# Patient Record
Sex: Male | Born: 1965
Health system: Southern US, Community
[De-identification: ages and names within clinical notes are randomized; demographics above are authoritative.]

## PROBLEM LIST (undated history)

## (undated) DIAGNOSIS — G629 Polyneuropathy, unspecified: Secondary | ICD-10-CM

## (undated) DIAGNOSIS — E119 Type 2 diabetes mellitus without complications: Secondary | ICD-10-CM

## (undated) DIAGNOSIS — F32A Depression, unspecified: Secondary | ICD-10-CM

## (undated) DIAGNOSIS — F341 Dysthymic disorder: Secondary | ICD-10-CM

## (undated) DIAGNOSIS — E785 Hyperlipidemia, unspecified: Secondary | ICD-10-CM

## (undated) DIAGNOSIS — F329 Major depressive disorder, single episode, unspecified: Secondary | ICD-10-CM

## (undated) HISTORY — PX: TOE AMPUTATION: SHX809

## (undated) HISTORY — PX: APPENDECTOMY: SHX54

---

## 2004-12-08 ENCOUNTER — Inpatient Hospital Stay (HOSPITAL_COMMUNITY): Admission: EM | Admit: 2004-12-08 | Discharge: 2004-12-11 | Payer: Self-pay | Admitting: Emergency Medicine

## 2005-03-16 ENCOUNTER — Inpatient Hospital Stay (HOSPITAL_COMMUNITY): Admission: AD | Admit: 2005-03-16 | Discharge: 2005-03-17 | Payer: Self-pay | Admitting: Orthopedic Surgery

## 2005-03-16 ENCOUNTER — Encounter (INDEPENDENT_AMBULATORY_CARE_PROVIDER_SITE_OTHER): Payer: Self-pay | Admitting: Specialist

## 2006-07-25 ENCOUNTER — Encounter (HOSPITAL_BASED_OUTPATIENT_CLINIC_OR_DEPARTMENT_OTHER): Admission: RE | Admit: 2006-07-25 | Discharge: 2006-09-09 | Payer: Self-pay | Admitting: Surgery

## 2006-07-27 ENCOUNTER — Ambulatory Visit (HOSPITAL_COMMUNITY): Admission: RE | Admit: 2006-07-27 | Discharge: 2006-07-27 | Payer: Self-pay | Admitting: Surgery

## 2006-09-09 ENCOUNTER — Encounter (HOSPITAL_BASED_OUTPATIENT_CLINIC_OR_DEPARTMENT_OTHER): Admission: RE | Admit: 2006-09-09 | Discharge: 2006-10-07 | Payer: Self-pay | Admitting: Surgery

## 2006-10-13 ENCOUNTER — Encounter (HOSPITAL_BASED_OUTPATIENT_CLINIC_OR_DEPARTMENT_OTHER): Admission: RE | Admit: 2006-10-13 | Discharge: 2006-11-09 | Payer: Self-pay | Admitting: Internal Medicine

## 2008-04-08 ENCOUNTER — Ambulatory Visit (HOSPITAL_COMMUNITY): Admission: RE | Admit: 2008-04-08 | Discharge: 2008-04-08 | Payer: Self-pay | Admitting: Orthopedic Surgery

## 2008-04-08 ENCOUNTER — Encounter: Admission: RE | Admit: 2008-04-08 | Discharge: 2008-04-08 | Payer: Self-pay | Admitting: Internal Medicine

## 2008-04-16 ENCOUNTER — Encounter (HOSPITAL_BASED_OUTPATIENT_CLINIC_OR_DEPARTMENT_OTHER): Admission: RE | Admit: 2008-04-16 | Discharge: 2008-06-05 | Payer: Self-pay | Admitting: Internal Medicine

## 2008-04-18 ENCOUNTER — Ambulatory Visit (HOSPITAL_COMMUNITY): Admission: RE | Admit: 2008-04-18 | Discharge: 2008-04-18 | Payer: Self-pay | Admitting: Internal Medicine

## 2008-11-16 ENCOUNTER — Emergency Department (HOSPITAL_COMMUNITY): Admission: EM | Admit: 2008-11-16 | Discharge: 2008-11-16 | Payer: Self-pay | Admitting: Emergency Medicine

## 2008-12-30 ENCOUNTER — Ambulatory Visit (HOSPITAL_COMMUNITY): Admission: RE | Admit: 2008-12-30 | Discharge: 2008-12-30 | Payer: Self-pay | Admitting: Internal Medicine

## 2009-09-01 ENCOUNTER — Ambulatory Visit (HOSPITAL_COMMUNITY): Admission: RE | Admit: 2009-09-01 | Discharge: 2009-09-01 | Payer: Self-pay | Admitting: General Surgery

## 2009-09-01 ENCOUNTER — Encounter (HOSPITAL_BASED_OUTPATIENT_CLINIC_OR_DEPARTMENT_OTHER): Admission: RE | Admit: 2009-09-01 | Discharge: 2009-11-04 | Payer: Self-pay | Admitting: General Surgery

## 2009-09-10 ENCOUNTER — Ambulatory Visit (HOSPITAL_COMMUNITY): Admission: RE | Admit: 2009-09-10 | Discharge: 2009-09-10 | Payer: Self-pay | Admitting: General Surgery

## 2009-09-10 ENCOUNTER — Ambulatory Visit: Payer: Self-pay | Admitting: Vascular Surgery

## 2009-09-19 ENCOUNTER — Encounter: Admission: RE | Admit: 2009-09-19 | Discharge: 2009-09-19 | Payer: Self-pay | Admitting: General Surgery

## 2009-09-24 ENCOUNTER — Ambulatory Visit (HOSPITAL_COMMUNITY): Admission: RE | Admit: 2009-09-24 | Discharge: 2009-09-24 | Payer: Self-pay | Admitting: General Surgery

## 2010-10-04 LAB — GLUCOSE, CAPILLARY: Glucose-Capillary: 186 mg/dL — ABNORMAL HIGH (ref 70–99)

## 2010-11-24 NOTE — Assessment & Plan Note (Signed)
[facility]   NAME:  Kevin Monroe, Kevin Monroe              ACCOUNT NO.:  [id number]   MEDICAL RECORD NO.:  [id number]      DATE OF BIRTH:  1966-01-29   PHYSICIAN:  Barry Dienes. Eloise Harman, M.D.     VISIT DATE:                                   OFFICE VISIT   SUBJECTIVE:  The patient is a 45 year old Caucasian man who was seen for  reevaluation of a neuropathic ulcer overlying the left first metatarsal  head.  He has diabetes mellitus, type 1, which has been under good  control with use of an insulin pump.  He also has a history of partial  amputation of the left great toe.  He was recently started on  doxycycline 100 mg twice daily and ciprofloxacin 500 mg twice daily.  He  has been treated with total contact casting while awaiting approval for  hyperbaric oxygen treatment.  His cast cracked recently, so he presented  today earlier for evaluation.  At last visit, he had ear wax removed  from the right ear.  He has mild discomfort in the right year with  minimal amount of blood coming from the ear since that time.  His  hearing is somewhat dull in the right ear.  He has not had fever or  chills.   OBJECTIVE:  VITAL SIGNS:  Blood pressure 118/79, pulse 70, respirations  18, temperature 98.2, and most recent capillary blood glucose level 110.  HEENT:  His right external auditory canal had a moderate amount of wax  within it and a small area of blood.  He has partially visualized  tympanic membrane which was normal.  He showed no signs of external  auditory canal infection.   Condition of his wound is as follows:  Wound #2:  Located on the left  foot overlying the first metatarsal head and measures 1.4 cm x 1.4 cm x  0.5 cm.  This wound does not have significant covering eschar and has a  pale pink wound base with no significant granulation tissue and no  exposed bone, tendon, or muscle.  There is no foul odor.  There is a  moderate amount of serosanguineous discharge and some  callus and  macerated tissue surrounding the ulcer.  There is a small amount of  necrotic tissue on the medial aspect of the ulcer.   ASSESSMENT:  No significant improvement in left foot neuropathic ulcer,  likely due to underlying myofasciitis seen on recent MRI scan.   PLAN:  The necrotic material was debrided from the ulcer with a #15  scalpel without significant pain or bleeding.  In addition, the necrotic  skin surrounding the ulcer was debrided with a #15 scalpel without  significant bleeding or discomfort.  There was some callus buildup on  the remnant of the left great toe that was debrided without difficulty.  The wound ulcer was then treated with Iodosorb covered by bulky dressing  followed by a total contact cast which should be reassessed in  approximately 1 week.  Plans are underway for him to have hyperbaric  oxygen treatment pending insurance company approval.  He also has a  scheduled appointment to see Dr. Dillard Cannon on April 26, 2008,  to evaluate the ear wax impaction and blood  within the right external  auditory canal.           ______________________________  Barry Dienes. Eloise Harman, M.D.     DGP/MEDQ  D:  04/23/2008  T:  04/24/2008  Job:  [id number]

## 2010-11-24 NOTE — Consult Note (Signed)
Kevin Monroe, Kevin Monroe              ACCOUNT NO.:  [id number]   MEDICAL RECORD NO.:  [id number]          PATIENT TYPE:  REC   LOCATION:  FOOT                         FACILITY:  [facility]   PHYSICIAN:  Jonelle Sports. Sevier, M.D. DATE OF BIRTH:  [birth year]   DATE OF CONSULTATION:  04/17/2008  DATE OF DISCHARGE:                                 CONSULTATION   HISTORY:  This is a 45 year old white male with long-standing type 1  diabetes had previously difficulty with a toe wound with osteomyelitis  in 2006.  At which time, he underwent amputation of his distal phalanx  of the left hallux.  Subsequent to that he never completely healed and  became infected again and was seen in this center and given a course of  hyperbaric oxygen therapy together with antibiotics and total contact  casting and the wound had completely healed by the time he was last seen  here by Dr. Tanda Rockers back in 2008.   He did well for awhile and then subsequently developed an ulcer on the  plantar aspect of the remaining proximal phalanx of that toe.  This  again stood over time, became somewhat infected, and he was seen and  placed on a course of antibiotics, namely doxycycline and Cipro twice  daily and sent for MRI, which showed evidence of myofasciitis and  tendinitis but no definite osteomyelitis.  There was no deep abscess.   Accordingly, he was referred here for further evaluation of the advice.  The initial orthopedist thinking that in all likelihood, he will need  open surgery again but now hoping that it can be avoided.   Past medical history is really essentially unchanged from his previous  visit and contains nothing great to concern.   He does have medication list at the moment, which includes:  1. NovoLog insulin on continuous infusion by pump.  2. Lisinopril 5 mg daily.  3. Celexa 40 mg daily.   He has known medicinal allergies.   In terms of review of systems, he is otherwise as far as he knows  completely well now.   PHYSICAL EXAMINATION:  Blood pressure  130/75, pulse 71, respirations  18, and temperature 97.9.  He is in no immediate distress.  Head, eyes,  ears, nose and throat show normal pupils.  Fundi are not visualized.  The left tympanic membrane is normal.  The right auditory canal is  impacted with dark wax, which is curetted out with some minor trauma to  the canal but then without tympanic membrane being benign as well.  His  chest is clear.  His heart is regular without murmur or gallop.  His  extremities are free of edema.  Distal pulses are everywhere palpable  and quite adequate.  Underlying  the plantar aspect of the remaining  stump of the left hallux is an ulcer measuring 1.4 x 1.4 x 0.6 cm in  depth.  It probes only to a fascial level and not definitely to bone.  There is some superficial redness of the entirety of the toe indicating  the underlying infection, which hopefully at this point  is not yet into  the bone.   DIAGNOSTIC IMPRESSION:  1. Diabetic foot ulcer.  2. [address] stage III left hallux stump, plantar aspect.   DISPOSITION:  The patient has been on doxycycline 100 mg b.i.d. and  Cipro 500 mg b.i.d. for approximately this 9 days.  He is to complete a  10-day course of that and is given another 10 days to add immediately to  follow up.   He will today be dressed with an application of Iodosorb underlying a  Allevyn pad and that extremity will be placed with a total contact cast.   Following obtaining of appropriate lab data etc., he will be started on  hyperbaric oxygen therapy to begin the day following this visit to  receive initially prescribed 20 dives at 2.4 atmospheres for 90 minutes  with two 5-minute air breaks each time.   Followup visit for review of the wound itself will be in 8 days.           ______________________________  Jonelle Sports Cheryll Cockayne, M.D.     RES/MEDQ  D:  04/17/2008  T:  04/18/2008  Job:  [id number]   cc:   Georgann Housekeeper, MD  Dr. August Saucer

## 2010-11-24 NOTE — Assessment & Plan Note (Signed)
[facility]   NAME:  Kevin Monroe, Kevin Monroe              ACCOUNT NO.:  [id number]   MEDICAL RECORD NO.:  [id number]      DATE OF BIRTH:  1965/09/15   PHYSICIAN:  Lenon Curt. Chilton Si, M.D.   VISIT DATE:  05/10/2008                                   OFFICE VISIT   HISTORY:  A 45 year old male with ulceration of the left great toe.  He  is undergoing hyperbaric oxygen therapy for this and is doing well.  There is some maceration in between the toes of the left foot.  The  ulceration of the toes.  He is making reasonably good progress with pink  granulation tissue at this time and minimal surrounding callus.  The  patient is satisfied with current treatment and response to therapy.   EXAM:  The left first metatarsal head and toes, wound measures 1.7 x 1.7  x 0.1.  It is clean and there is good granulation tissue.  There is some  maceration between the toes of the left foot.   TREATMENT:  Total contact casting was completed after HBO therapy.  Recommended application of Lamisil between the toes.  The patient will  return in a week for followup evaluation for repeat casting.      Lenon Curt Chilton Si, M.D.  Electronically Signed     AGG/MEDQ  D:  05/10/2008  T:  05/11/2008  Job:  [id number]

## 2010-11-24 NOTE — Assessment & Plan Note (Signed)
[facility]   NAME:  Kevin Monroe, Kevin Monroe              ACCOUNT NO.:  [id number]   MEDICAL RECORD NO.:  [id number]      DATE OF BIRTH:  1966-04-20   PHYSICIAN:  Jonelle Sports. Sevier, M.D.  VISIT DATE:  05/01/2008                                   OFFICE VISIT   HISTORY:  This 45 year old white male diabetic with left hallucal stump  ulcer is seen for weekly evaluation in association with total contact  casting and hyperbaric oxygen treatments.  He has completed today his  second HBO treatment.  He has been in a cast now for a total of 2 weeks.   The patient's original MRI showed a lot of deep space inflammation  without any definite loculation of pus and he has received appropriate  courses of antibiotic therapy.  Because of these ligamentous and other  deep structures being involved with an inflammatory process, it was felt  this really constituted a Wagner grade 3 ulcer.  Accordingly, the basis  for aggressive treatment as we are undertaking.   The patient reports that he has had no problems with a cast itself, nor  he has had any difficulty with HBO treatments (had been treated once  before).   PHYSICAL EXAMINATION:  VITAL SIGNS:  Blood pressure 148/82, pulse 70,  respirations 20, and temperature 98.1.  EXTREMITIES:  The wound on the plantar aspect of the hallucal stump on  the left now the plantar aspect measures 1.9 x 1.9 x 0.2 cm.  The base  is fairly clean.  There is no spreading inflammation.  No tenderness.  No drainage.  No odor.  The degree of swelling and erythema in the  remainder of the hallux in that first MP joint area is striking less  than when his treatment began 2 weeks ago.   IMPRESSION:  Satisfactory course early on and Wagner stage 3 diabetic  foot ulcer.   DISPOSITION:  The patient will be treated with a repeat application of  Iodosorb covered by dry dressing and the left lower extremity returned  to total contact cast.   He will continue  hyperbaric oxygen therapy treatments on a 5-day per  week basis.   The next wound evaluation will be in 1 week unless indicated sooner.            ______________________________  Jonelle Sports Cheryll Cockayne, M.D.     RES/MEDQ  D:  05/01/2008  T:  05/02/2008  Job:  [id number]

## 2010-11-24 NOTE — Assessment & Plan Note (Signed)
[facility]   NAME:  Kevin, Monroe              ACCOUNT NO.:  [id number]   MEDICAL RECORD NO.:  [id number]      DATE OF BIRTH:  12-Oct-1965   PHYSICIAN:  Maxwell Caul, M.D. VISIT DATE:  06/04/2008                                   OFFICE VISIT   [name] is a 45 year old type 1 diabetic who is status post partial  amputation of his left first toe.  He has had an ulcer on the first  metatarsal head associated with a deep infection.  He completed  hyperbaric oxygen therapy, and the wound has been closing down nicely.   On examination, his temperature is 98.5, pulse 75, respirations 20,  blood pressure 125/72.  The area of the previous Wagner grade 3 wound is  resolved.  There was no need of debridement.  There was nothing that  needed culturing here.  The wound is totally epithelialized.  I have  advised him to continue to keep this area covered.  He has custom-made  inserts for his shoes, and he is discharged at this point.  We will see  him again on p.r.n. as necessary.           ______________________________  Maxwell Caul, M.D.     MGR/MEDQ  D:  06/04/2008  T:  06/04/2008  Job:  [id number]   cc:   G. Dorene Grebe, M.D.  Georgann Housekeeper, MD

## 2010-11-24 NOTE — Assessment & Plan Note (Signed)
[facility]   NAME:  Kevin Monroe, Kevin Monroe NO.:  [id number]   MEDICAL RECORD NO.:  [id number]      DATE OF BIRTH:  09-09-1965   PHYSICIAN:  Maxwell Caul, M.D. VISIT DATE:  05/16/2008                                   OFFICE VISIT   [name] is a gentleman who we have been treating here for what was a  Wagner grade 3 wound over his left foot overlying the first metatarsal  head.  This has been receiving HBO, as well as a total contact casting.  He has really done quite nicely.  Today, the wound is really minuscule.  There was a slight amount of hyper granulation protruding from some  really quite healthy looking epithelialization.  We knocked down the  hypergranulation with a silver nitrate stick.  At this point, I really  do not think that HBO will need to be continued __________  tomorrow.  Furthermore, I do not think the total contact casting is necessary.  We  simply put him in a Darco Wedge.   IMPRESSION:  Wagner grade 3 diabetic foot wound.  This almost has  completely resolved.  We have put him in a Darco Wedge with a dry  dressing.  We will see him back in a week.  He will do his last  __________ tomorrow.  The TCC-EZ was also discontinued today.           ______________________________  Maxwell Caul, M.D.     MGR/MEDQ  D:  05/16/2008  T:  05/17/2008  Job:  [id number]

## 2010-11-24 NOTE — Assessment & Plan Note (Signed)
[facility]   NAME:  Kevin Monroe, Kevin Monroe              ACCOUNT NO.:  [id number]   MEDICAL RECORD NO.:  [id number]      DATE OF BIRTH:  01/17/1966   PHYSICIAN:  Jonelle Sports. Sevier, M.D.  VISIT DATE:  05/22/2008                                   OFFICE VISIT   HISTORY:  This 45 year old white male with type 1 diabetes, status post  partial amputation of the left hallux, has been managed for an ulcer of  the first metatarsal head area with deep infection.  He has completed a  course of hyperbaric oxygen therapy, and the wound now is completely  scabbed over.  He is here today for what was hoped to be his last  clinical visit.   He has been in a Darco walker and continues on doxycycline and Cipro,  which he had been given for the deep infection, which we will continue  for approximately an additional week.  He has had no fever, chills, or  systemic symptoms.  The swelling in the area has progressively  decreased, and there have been no other signs of difficulty.   PHYSICAL EXAMINATION:  Blood pressure 123/79, pulse 78, respirations 16,  and temperature 98.4.  Random postprandial glucose 263 mg/dL.   The wound on the plantar aspect of the left foot at the first metatarsal  head area is totally calloused over at this point.  There is no evidence  of surrounding erythema, no pain or tenderness.   IMPRESSION:  Resolution of diabetic foot ulcer, originally Wagner 3,  plantar aspect of the left foot.   DISPOSITION:  The wound is selectively debrided of the callus and with  this, shows an epithelialized underlying wound.   The wound will be treated with an application of Neosporin covered with  a Band-Aid, and the patient will be instructed to do this on a daily  basis.   He will continue in his Darco wedge healing sandal, and is due to report  tomorrow to the orthotic shot for fashioning of the proper custom-molded  insert with a mark to prevent the pistoning of that  first toe and  metatarsal head complex.   Followup visit is set here for 2 weeks.           ______________________________  Jonelle Sports Cheryll Cockayne, M.D.     RES/MEDQ  D:  05/22/2008  T:  05/22/2008  Job:  [id number]

## 2010-11-27 NOTE — Consult Note (Signed)
NAMEAUTUMN, [name]              ACCOUNT NO.:  [id number]   MEDICAL RECORD NO.:  [id number]          PATIENT TYPE:  INP   LOCATION:  3020                         FACILITY:  [facility]   PHYSICIAN:  Burnard Bunting, M.D.    DATE OF BIRTH:  April 07, 1966   DATE OF CONSULTATION:  12/08/2004  DATE OF DISCHARGE:                                   CONSULTATION   CHIEF COMPLAINT:  Left great toe pain.   HISTORY OF PRESENT ILLNESS:  Kevin Monroe is a 45 year old patient with  type 1 diabetes who describes about a three-year history of left great toe  ulceration, he also describes a two-day history of fever, chills and pain  and left great toe pain.  He states about six months ago he ripped the skin  off his great toe and he has had a bleeding ulcer on and off since, he has  [facility].  He states  that the ulcer has never quite fully healed.  He denies any weight loss or  any other chest pain or shortness of breath.   PAST MEDICAL HISTORY:  1. Diabetes.  2. Dysthymia.  3. Allergic rhinitis.     FAMILY HISTORY:  Notable for mother with diabetes and thyroid disease, she  is 89 years old.  Father comitted suicide at age 5 with diabetes and  depression.  He has a sister with diabetes.  The patient does not smoke.  He  drinks occasionally.  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   MEDICATIONS ON ADMISSION:  Lantus 35 units q.h.s. as well as sliding-scale  NovoLog.   HE HAS NO KNOWN DRUG ALLERGIES.   REVIEW OF SYSTEMS:  There is no chest pain, shortness of breath.  Does  report bilateral foot neuropathy.   PHYSICAL EXAMINATION:  On admission temperature 102.7, blood pressure  102/47, pulse 88, respirations 18, 96% sat on room air.  He has good  cervical spine range of motion, full range of motion of the upper  extremities, wrists, elbows and shoulders.  Palpable radial pulses.  No  motor sensory  function bilateral upper extremities is intact, bilateral  lower extremities demonstrates the pedal pulses palpable in both feet.  He  has palpable intact nontender anterior tib, posterior __________and Achilles  tendon bilaterally with symmetric range of motion tibia talar transverse  tarsal and subtalar region motion.  He does have swelling of his great toe.  He has diabetic nephropathy with decreased sensation to light touch on the  dorsal plantar aspect of both feet.  He has an ulceration with purulent  discharge from the left great toe.  The great toe has some swelling and  erythema.  There is no dorsal or plantar swelling or erythema on the  forefoot.  He has good range of motion of the great toe.   LABORATORY VALUES:  Include white count 13,000, hemoglobin 15.8, sodium 134,  potassium 3.8, BUN 16, creatinine 1.2, blood glucose 280, he has no edema in  the  lower extremities.   IMPRESSION:  Foot ulceration and infection.   PLAN:  Radiographs, which are pending.  He will need incision and drainage  today, and if he does have osteomyelitis he may require an  metatarsophalangeal amputation.  We will make him n.p.o., hold his Lovenox  and plan for surgery this afternoon.  Risks and benefits were discussed with  the patient, they include, but are not limited to possible further  amputation on the foot as well as nonhealing of the wound.  The patient  understands the risks and wishes to proceed.  All questions were answered.      GSD/MEDQ  D:  12/08/2004  T:  12/08/2004  Job:  [id number]

## 2010-11-27 NOTE — Assessment & Plan Note (Signed)
[facility]   NAME:  Kevin Monroe, Kevin Monroe              ACCOUNT NO.:  [id number]   MEDICAL RECORD NO.:  [id number]           DATE OF BIRTH:   PHYSICIAN:  Maxwell Caul, M.D. VISIT DATE:  09/02/2006                                   OFFICE VISIT   PURPOSE OF TODAY'S VISIT:  Followup of a Wagner's grade III wound on his  foot, involving the base of his left hallux.  He has been treated with  continued HBO, full contact casting.   WOUND EXAM:  This wound is now 100% epithelized.  I had considered not  replacing the cast; however, in looking at his history and the fact that  the overlying skin is not fully healed, I elected to put him back in the  cast.  I think that by next week, at this time, he should be ready for  discharge.  I wrote him for a pair of diabetic foot wear, which  surprisingly he has never owned.   MANAGEMENT PLAN & GOAL:  We have placed him back in the contact cast.  I  think after next week's dives he should be fully ready for discharge at  that point.  I am forecasting full healing of this in the next week.           ______________________________  Maxwell Caul, M.D.     MGR/MEDQ  D:  09/02/2006  T:  09/03/2006  Job:  [id number]

## 2010-11-27 NOTE — Op Note (Signed)
NAMELUISDAVID, Kevin Monroe              ACCOUNT NO.:  [id number]   MEDICAL RECORD NO.:  [id number]          PATIENT TYPE:  INP   LOCATION:  5012                         FACILITY:  [facility]   PHYSICIAN:  Burnard Bunting, M.D.    DATE OF BIRTH:  1965-11-02   DATE OF PROCEDURE:  03/16/2005  DATE OF DISCHARGE:  03/17/2005                                 OPERATIVE REPORT   PREOPERATIVE DIAGNOSIS:  Left great toe infection.   POSTOPERATIVE DIAGNOSIS:  Left great toe infection.   OPERATION PERFORMED:  Left great toe amputation.   SURGEON:  Burnard Bunting, M.D.   ANESTHESIA:  General.   ESTIMATED BLOOD LOSS:  Minimal.   SPECIMENS:  Toe to pathology.   DESCRIPTION OF PROCEDURE:  The patient was brought to the operating room  where general endotracheal anesthesia was induced.  Preoperative antibiotics  were administered.  The left foot was prepped with Hibiclens and saline and  draped in sterile manner.  [place] incision was made in the midportion of  the proximal phalanx.  Skin and subcutaneous tissue were sharply divided.  Periosteal elevation was used to develop full thickness skin and periosteal  flaps off of the proximal phalanx.  The oscillating saw was then used to  amputate the proximal phalanx at the junction of its proximal and distal two  thirds.  Healthy skin flaps were maintained.  Necrotic and infected  appearing tissue was distal to the incision.  Ankle Esmarch was utilized for  this portion of the case.  It was released.  Good bleeding tissue and bone  was noted.  Skin flaps were then closed using 3-0 nylon suture with  excellent closure achieved.  The patient was placed in a bulky dressing,  tolerated procedure well without immediate complications.           ______________________________  G. Dorene Grebe, M.D.     GSD/MEDQ  D:  05/13/2005  T:  05/13/2005  Job:  [id number]

## 2010-11-27 NOTE — Assessment & Plan Note (Signed)
[facility]   NAME:  Kevin Monroe, Kevin Monroe              ACCOUNT NO.:  [id number]   MEDICAL RECORD NO.:  [id number]      DATE OF BIRTH:  1966-06-23   PHYSICIAN:  Maxwell Caul, M.D.      VISIT DATE:                                   OFFICE VISIT   PURPOSE OF TODAY'S VISIT:  Continued followup of his ulcer on the  plantar aspect of his left great toe, he is being seen in conjunction  with continued HBO and total-contact casting.   WOUND EXAM:  This wound is now resolved.  I did debride a large amount  of callous from the surrounding area of this wound, however there are no  open areas.  The wound itself is covered by healthy epithelization.   MANAGEMENT PLAN & GOAL:  He can discontinue from HBO now.  I have  advised him to clean the area with an anti-bacterial soap and protect it  with something such as Allevyn adhesive.  He is to continue in his  healing sandal.  He has an appointment next week for diabetic footwear.  We will see him back in followup in two weeks.  The wound as of this  point is declared resolved.           ______________________________  Maxwell Caul, M.D.     MGR/MEDQ  D:  09/16/2006  T:  09/16/2006  Job:  [id number]

## 2010-11-27 NOTE — Assessment & Plan Note (Signed)
[facility]   NAME:  Kevin Monroe, Kevin Monroe              ACCOUNT NO.:  [id number]   MEDICAL RECORD NO.:  [id number]      DATE OF BIRTH:  [birth year]   PHYSICIAN:  Maxwell Caul, M.D.      VISIT DATE:                                   OFFICE VISIT   PURPOSE OF TODAY'S VISIT:  Patient is seen in follow up from his  hyperbaric treatment.  He is being treated for an ulcer on the plantar  aspect of his left great toe.  He has been treated with hyperbaric,  oxygen and total contact casting.  There has been continuous improvement  in this area.   WOUND EXAM:  The wound is now close to 100% reepithialized.  I did a  debridement of a large amount of callous from this wound and surrounding  areas.  There is only a tiny bit of open area in the middle of the  wound.  The rest of this looks like healthy epithelization.   WOUND CARE PLAN AND FOLLOWUP:  I am going to continue him with  hyperbaric oxygen for one more week and total contact casting for one  more week.  Although this is almost 100% healed I just do not think the  area is vibrant enough to survive without pressure relief.  I did give  him a prescription for diabetic foot wear last wear.  I do not think he  has filled it as of yet.           ______________________________  Maxwell Caul, M.D.     MGR/MEDQ  D:  09/09/2006  T:  09/10/2006  Job:  [id number]

## 2010-11-27 NOTE — Assessment & Plan Note (Signed)
[facility]   NAME:  Kevin Monroe, Kevin Monroe              ACCOUNT NO.:  [id number]   MEDICAL RECORD NO.:  [id number]      DATE OF BIRTH:  04-21-1966   PHYSICIAN:  Maxwell Caul, M.D. VISIT DATE:  10/13/2006                                   OFFICE VISIT   IMPRESSION:  Purpose of today's visit, recurrent blister on left great  toe.   HISTORY:  The patient is a patient who was recently completed hyperbaric  oxygen therapy for Wegner's grade 3 diabetic foot wound.  He had been  discharged in a healing sandal and he was waiting to obtain proper  diabetic footwear.  Unfortunately he noted a blood blister on the  surface of  his, on the plantar surface of his left great toe and came  in for [place] to evaluate this today.  He is really uncertain how this  happened.  He has been wearing the healing sandal only.  Has not really  noted anything different.  He promised me he has not been walking around  in his bare feet at home.  There is no obvious trauma. He is not  systemically ill.   PHYSICAL EXAMINATION:  Wound examination:  There was a blister present  here.  This underwent a full-thickness debridement with no anesthesia  required.  Hemostasis was with direct pressure.  We used a #15 blade.  There was a scant amount of sanguinous fluid.  There was no evidence of  infection.  Nothing was cultured.  The base of this wound appeared clean  and granulating.   We reviewed his shoes and the felt really had not shifted that much.  I  could not obviously see why this should have reoccurred.  We did  readjust the felt, however.   IMPRESSION:  Wegner's grade 2 wound on recurrent wound on his left great  toe.  Dimensions say 0.6 x 0.8 x 0.3.  He is to apply Bactroban to this  wound, change daily and we readjusted his felt wraps.  We will see him  again in a week.           ______________________________  Maxwell Caul, M.D.     MGR/MEDQ  D:  10/13/2006  T:  10/13/2006   Job:  [id number]

## 2010-11-27 NOTE — H&P (Signed)
Kevin Monroe, HAZARD NO.:  [id number]   MEDICAL RECORD NO.:  [id number]          PATIENT TYPE:  INP   LOCATION:  3020                         FACILITY:  [facility]   PHYSICIAN:  Ladell Pier, M.D.   DATE OF BIRTH:  1965/07/18   DATE OF ADMISSION:  12/07/2004  DATE OF DISCHARGE:                                HISTORY & PHYSICAL   HISTORY OF PRESENT ILLNESS:  The patient is a 45 year old white male with a  past medical history significant for type 1 diabetes and dysthymia.  The  patient presented to the ED with two days of fevers, chills and left foot  pain.  He states that he has an ulcer on the left great toe and he complains  of some pain in the left groin area where the lymph nodes are located.  He  said that about six months ago he ripped the skin off of his great toe, he  was walking barefooted on the carpet and since then he has been having some  tenderness on and off of the great toe.  He has had a foot ulcer in the past  and he has seen Dr. Lajoyce Corners a few years back, but he states the toe never quite  totally healed.   PAST MEDICAL HISTORY:  1. Diabetes.  2. Dysthymia.  3. Allergic rhinitis.     FAMILY HISTORY:  Mother has diabetes and thyroid disease, she is 69 years  old.  Father died committed suicide at 41, he had diabetes and depression.  He has a sister with diabetes.   SOCIAL HISTORY:  He does not smoke, he drinks socially.  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   MEDICATIONS:  1. Lantus 35 units q.h.s.  2. Sliding scale NovoLog.     ALLERGIES:  NO KNOWN DRUG ALLERGIES.   REVIEW OF SYSTEMS:  No chest pain or shortness of breath.  He complains of  fever, chills, some cough at night for the past month, pain in the foot,  neuropathy in the foot.   PHYSICAL EXAMINATION:  VITAL SIGNS:  On admission, temperature 102.7, blood  pressure 102/47, pulse 88, respirations 18, 96% on room air.  HEENT:  Head is  normocephalic, atraumatic.  Pupils are equal, round and  reactive to light, without erythema.  CARDIOVASCULAR:  Regular rate and rhythm.  ABDOMEN:  Positive bowel sounds.  EXTREMITIES:  Without edema, 2+ pulses bilaterally, great toe ulcer.   LABORATORY DATA:  X-ray of the foot pending and chest x-ray pending.   WBC 15.8, hemoglobin 13.7, platelets 168,000, and MCV 90.  Sodium 134,  potassium 2.8, chloride 100, CO2 27, BUN 16, creatinine 1.2, glucose 280.   ASSESSMENT AND PLAN:  1. Diabetic foot ulcer.  We will admit to a regular bed, start      antibiotics, Unasyn, IV, consult Dr. Lajoyce Corners.  2. Allergic rhinitis.  The patient is complaining of some allergy      symptoms.  We will start him on Flonase, Allegra and Tussionex p.r.n.  3. Diabetes.  We will  continue him on his own home medications.        NJ/MEDQ  D:  12/08/2004  T:  12/08/2004  Job:  [id number]

## 2010-11-27 NOTE — Assessment & Plan Note (Signed)
[facility]   NAME:  Kevin, Monroe              ACCOUNT NO.:  [id number]   MEDICAL RECORD NO.:  [id number]      DATE OF BIRTH:  25-Feb-1966   PHYSICIAN:  Theresia Majors. Tanda Rockers, M.D.      VISIT DATE:                                   OFFICE VISIT   VITAL SIGNS:  Blood pressure 113/73, respirations 18, pulse rate 73,  temperature 98.3.  Capillary blood glucose was 43 mg% (patient has an  insulin pump).   PURPOSE OF TODAY'S VISIT:  Kevin Monroe is a 45 year old man who has a  Wagner grade 4 diabetic foot ulcer involving the left hallux.  We  treated him with calcium alginate, an offloading healing sandal, and  proceeded with precertification for hyperbaric oxygen treatment.  In the  interim, he reports that he has had no excessive drainage, malodor, or  excessive pain.   WOUND EXAM:  Inspection of the wound shows that, after removing the  calcium alginate remnants, the wound is clean, there is no evidence of  loculation or abscess.  The pedal pulses remain palpable, and there is  no evidence of ischemia.   Review of his ordered labs shows that his hemoglobin is normal, his  electrolytes are normal including a elevated blood sugar of 131 mg%.  The PA and lateral chest film shows no acute changes or bullous disease.  The electrocardiogram has shown no evidence of acute changes.   DIAGNOSIS:  Wagner grade 4 diabetic foot ulcer refractory to generally  accepted forms of therapy, including debridement, antibiotics, and  offloading.   MANAGEMENT PLAN & GOAL:  We have scheduled the patient to begin  hyperbaric oxygen treatment at 100% oxygen for 90 minutes at 2  atmospheres.  He will begin this treatment as soon as the dive schedule  permits.   With regard to his insulin pump, we will discontinue the pump at the  time of his dive and reinitiate it following.  We will do pre- and post-  capillary blood glucoses as per protocol.   We have the explained the indications  and possible complications  including baric trauma, claustrophobia, and oxygen toxicity to the  patient in terms that he seems to understand.  He gives his verbal and  written informed consent to proceed as outlined.           ______________________________  Theresia Majors. Tanda Rockers, M.D.     Kevin Monroe  D:  08/02/2006  T:  08/02/2006  Job:  [id number]

## 2010-11-27 NOTE — Assessment & Plan Note (Signed)
[facility]   NAME:  Kevin Monroe, Kevin Monroe              ACCOUNT NO.:  [id number]   MEDICAL RECORD NO.:  [id number]      DATE OF BIRTH:  October 20, 1965   PHYSICIAN:  Maxwell Caul, M.D. VISIT DATE:  08/26/2006                                   OFFICE VISIT   [name] was seen today in followup from his hyperbaric treatment.  He  is being treated for an ulcer on the plantar surface of his left great  toe.  Measurements today were 0.3 x 0.3 x 0.1.  This wound is continuing  to show considerable improvement.  The base of it is now at least 50%  epithelialized.  The rest of this is showing good granulation tissue.   WOUND CARE PLAN AND FOLLOWUP:  He will continue in a total contact cast.  I applied Bactroban to this today due to the culture Staph aureus;  however, there is no evidence of significant infection in the wound or  in the surrounding tissues currently.  We will continue with  hyperbarics; however, at this rate, I think this wound will probably  heal in the next 1-2 weeks.           ______________________________  Maxwell Caul, M.D.     MGR/MEDQ  D:  08/26/2006  T:  08/27/2006  Job:  [id number]

## 2010-11-27 NOTE — Discharge Summary (Signed)
NAMEKESLEY, GAFFEY              ACCOUNT NO.:  [id number]   MEDICAL RECORD NO.:  [id number]          PATIENT TYPE:  INP   LOCATION:  3020                         FACILITY:  [facility]   PHYSICIAN:  Georgann Housekeeper, MD      DATE OF BIRTH:  1966-06-09   DATE OF ADMISSION:  12/07/2004  DATE OF DISCHARGE:  12/11/2004                                 DISCHARGE SUMMARY   DISCHARGE DIAGNOSES:  1.  Left great toe infection wound.  2.  Type 1 diabetes.   CONSULTATION:  Orthopedics, with Dr. August Saucer.   PROCEDURES:  1.  Debridement, surgical.  2.  X-rays of the left toe were negative for osteomyelitis.  3.  Wound cultures grew out Staphylococcus and Staphylococcus species.  No      osteomyelitis as per orthopedics.   MEDICATIONS ON DISCHARGE:  1.  Celexa 40 mg daily.  2.  Altace 2.5 mg daily.  3.  Lantus 45 units daily.  4.  Insulin sliding scale NovoLog.  5.  Augmentin 875 b.i.d. for two weeks.   ACTIVITIES:  As tolerated.   WOUND CARE:  Dressing changes for the wound on the home health nursing.   FOLLOWUP:  1.  With orthopedics.  2.  With me in two weeks.   HOSPITAL COURSE:  1.  A 45 year old type 1 diabetic with a history of left toe infection came      in with fevers and elevated white count, and orthopedics was consulted.      Went to the O.R. for surgical debridement and to see if there was an      infection in the bone.  There was no evidence of any osteomyelitis or      bony involvement.  He had deep debridement, surgical, and had wound      care.  Continued IV antibiotics of Unasyn.  His white initially was      15,000, dropped to 5,200.  Remained afebrile.  As far as his followup,      with the orthopedics and wound care.  2.  Type 1 diabetes.  Moderately to poor control.  He is on Lantus and      sliding scale.  Information about insulin pump was provided.  His A1c      came out to be 8.2.  Lantus was adjusted to 35-45 units.  Continue on      sliding      scale before meals.   Continue on his Altace.  We tried to get his      diabetes under more strict control.  Nutritional and diabetic teaching      was also provided.  I will see him back in the office in two weeks.   CONDITION ON DISCHARGE:  Stable.      KH/MEDQ  D:  12/11/2004  T:  12/11/2004  Job:  [id number]

## 2010-11-27 NOTE — Op Note (Signed)
NAMERYON, LAYTON              ACCOUNT NO.:  [id number]   MEDICAL RECORD NO.:  [id number]          PATIENT TYPE:  INP   LOCATION:  3020                         FACILITY:  [facility]   PHYSICIAN:  Burnard Bunting, M.D.    DATE OF BIRTH:  1966/03/26   DATE OF PROCEDURE:  12/08/2004  DATE OF DISCHARGE:  12/11/2004                                 OPERATIVE REPORT   PREOPERATIVE DIAGNOSIS:  Left great toe infection.   POSTOPERATIVE DIAGNOSIS:  Left great toe infection.   PROCEDURE:  Left great toe I&D.   SURGEON:  Burnard Bunting, MD.   ASSISTANT:  None.   ANESTHESIA:  Ankle block.   PROCEDURE IN DETAIL:  The patient was brought to the operating room where an  ankle block and MAC anesthesia were induced.   The __________ were maintained.  The right foot was prepped with Hibiclens  and saline and draped in a sterile manner.  The debridement of the plantar  surface of the great toe was performed.  Debridement included skin,  subcutaneous tissue, muscle, and fascia.  There was no bone infection noted.  Following debridement over a 2.5 x 1-cm area, the toe was thoroughly  irrigated and reapproximated loosely over a small Iodosorb __________ using  3-0 nylon sutures.  The patient was placed in a bulky dressing and a cast  show.  He tolerated the procedure well without any immediate complications.       GSD/MEDQ  D:  01/17/2005  T:  01/17/2005  Job:  [id number]

## 2010-11-27 NOTE — Consult Note (Signed)
Kevin Monroe              ACCOUNT NO.:  [id number]   MEDICAL RECORD NO.:  [id number]          PATIENT TYPE:  REC   LOCATION:  FOOT                         FACILITY:  [facility]   PHYSICIAN:  Theresia Majors. Tanda Rockers, M.D.DATE OF BIRTH:  August 25, 1965   DATE OF CONSULTATION:  07/25/2006  DATE OF DISCHARGE:                                 CONSULTATION   REASON FOR CONSULTATION:  Kevin Monroe is a 45 year old diabetic referred  by Dr. Burnard Bunting, MD for evaluation of a nonhealing ulcer of the  left great toe.   IMPRESSION:  Wagner grade 3 diabetic foot ulcer.   RECOMMENDATIONS:  Proceed with hyperbaric oxygen treatment, serial  debridements and offloading.   SUBJECTIVE:  Kevin Monroe is a 45 year old diabetic who has had an  ulceration over the left hallux for the last year and half.  His  treatment has included surgical resection of the distal phalanx, serial  debridements, antibiotics and offloading.  In spite of these efforts, he  continues to drain.  He has had interim evaluations by the [facility]  at [facility] and has failed to keep appointments with Dr. August Saucer.  He  denies fever, excessive drainage or malodor.  He has a utilize local  wound care at home.   He is a brittle diabetic.  He has an insulin pump.  His fasting blood  sugars are checked serially throughout the day and all are between 150-  200 mg percent.  His most recent hemoglobin A1c was 6.9.   PAST MEDICAL HISTORY:  1. Remarkable for hypertension.  2. Diabetic retinopathy.  3. Diabetes.   CURRENT MEDICATION:  1. Lisinopril 20 mg q. daily.  2. Insulin NovoLog 30 units.  3. Insulin pump titrations.   ALLERGIES:  He denies allergies.   PAST SURGERIES:  1. Appendectomy.  2. Multiple debridements of the left great toe.  3 . Amputation of the distal phalanx of the great toe.   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   REVIEW OF SYSTEMS:  Remarkable for stable weight.  He is active.  He  denies claudication, dyspnea on exertion.  He has recently been seen by  Dr. Luciana Axe and underwent laser treatment for retinopathy.  His vision  remains stable, but requires glasses.  He denies bowel or bladder  dysfunction.  The remainder of the review of systems is negative.   PHYSICAL EXAMINATION:  GENERAL:  He is an alert, oriented man in no  acute distress, answering questions appropriately and appears in good  contact with reality.  VITAL SIGNS:  Blood pressure is 110/88, respirations 18, pulse rate 72  and temperature is 98.2.  Capillary blood glucose is 141 mg percent.  HEENT:  Exam is clear.  NECK:  Supple.  Trachea is midline.  Thyroid is nonpalpable.  LUNGS:  Clear.  HEART:  Sounds are normal.  ABDOMEN:  Soft.  EXTREMITY:  There is remarkable for the absence of edema.  On the left  lower extremity, there is a well-healed surgical incision consistent  with amputation of the distal phalanx of the left hallux.  On the volar  aspect of the foot, there is a large necrotic ulcerated wound extending  down to periosteum and with marked granulation and friability.  This  wound was cultured.  The rongeurs were used to perform effect a full-  thickness debridement.  Hemorrhage was controlled with direct pressure.  The cultures were forwarded.  NEUROLOGICAL:  The patient is insensate.  There is no regional  adenopathy.   DISCUSSION:  This 45 year old male has a wound on the left great toe of  approximately a year and 4 months duration.  He has been treated all  conventional efforts by multiple physicians including a dedicated wound  center.  In spite of these efforts, he continues to have an ulceration.  We have recommended that we proceed aggressively to effect closure, to  include the use of hyperbaric oxygen.  In preparation, we have ordered a  CBC,  a complete metabolic profile, electrocardiogram, transcutaneous  oxygen determination, PA and lateral chest film and a plain x-ray of the  left foot.  If these studies have been obtained recently, we will use  those results as provided by the primary care physician.  Otherwise, we  will order a new studies.   We have explained the indications of hyperbaric oxygen treatment to the  patient in terms that he understands.  We have specifically indicated  the duration of the treatment as approximately 2-1/2 to 3 hours of total  time commitment daily.  The patient will require approximately 30  treatments.  We anticipate that this wound should heal.  We have  recommended also that we place him in and offloading apparatus of total  contact casting.  The patient insists that he cannot be off of his feet  for 48 hours, and therefore, we have provided him with the healing  sandal with transverse offloading felt to substitute for the total  contact cast.  We will proceed with contacting his insurance carrier for  precertification for the proposed treatment.  We will reevaluate the  patient in one week to assess his initial response to offloading.   The patient has been given an opportunity to ask questions.  He  indicates that he will discuss the implications of HBO therapy with his  employer and his wife.  We will reevaluate him in one week.  He  expresses gratitude for having been seen in the clinic and indicates his  strongly desire to proceed with treatment as outlined.           ______________________________  Theresia Majors. Tanda Rockers, M.D.    Kevin Monroe  D:  07/25/2006  T:  07/25/2006  Job:  [id number]   cc:   Georgann Housekeeper, MD  G. Dorene Grebe, M.D.

## 2010-11-27 NOTE — Assessment & Plan Note (Signed)
[facility]   NAME:  Kevin Monroe, Kevin Monroe              ACCOUNT NO.:  [id number]   MEDICAL RECORD NO.:  [id number]      DATE OF BIRTH:  06/15/1966   PHYSICIAN:  Maxwell Caul, M.D.      VISIT DATE:                                   OFFICE VISIT   VITAL SIGNS:   PURPOSE OF TODAY'S VISIT:  Review of nonhealing ulcer of his left great  toe.  This was seen by Dr. Tanda Rockers on January 14, evaluated as a  Wagner's grade 3 diabetic foot wound.  He is being prepped for  hyperbaric oxygen treatment.  I have gone over this and discussed this  with him today.  His pre HBO workup has been generally unremarkable.  The only problem is he has an insulin pump, which he will need to  disconnect for the 2-1/2 hours of time to complete a dive.  I have  discussed this with him.  We would do his CBGs pre and post.  We will  probably need to contact his primary physician, Dr. Rene Paci, to discuss  this.   WOUND EXAM:   WOUND SINCE LAST VISIT:   CHANGE IN INTERVAL MEDICAL HISTORY:   DIAGNOSIS:   TREATMENT:   ANESTHETIC USED:   TISSUE DEBRIDED:   LEVEL:   CHANGE IN MEDS:   COMPRESSION BANDAGE:   OTHER:   MANAGEMENT PLAN & GOAL:  We went over all of the issues with regards to  his dive.  He expressed understanding.  We will need to contact his  primary physician; however, I think simply cutting his NovoLog pump off  for 2-1/2 hours should not be particularly problematic.           ______________________________  Maxwell Caul, M.D.     MGR/MEDQ  D:  08/05/2006  T:  08/06/2006  Job:  [id number]

## 2010-11-27 NOTE — Assessment & Plan Note (Signed)
[facility]   NAME:  SHIGERU, LAMPERT              ACCOUNT NO.:  [id number]   MEDICAL RECORD NO.:  [id number]      DATE OF BIRTH:  [birth year]   PHYSICIAN:  Maxwell Caul, M.D. VISIT DATE:  08/12/2006                                   OFFICE VISIT   PURPOSE OF TODAY'S VISIT:  Mr. Lusby is seen after his hyperbaric  oxygen treatment today.  The total contact cast was removed.  He is  being treated for a Wagner grade IV ulceration.  His pre-dive capillary  blood glucose was 246 and post-dive 334.  His pump is back on now.   WOUND EXAM:  He is afebrile.  Temperature is 98.5.  The wound itself is  clean.  There is reasonable granulation.  There is on evidence of  cellulitis or infection.   DIAGNOSIS:  Wagner grade IV diabetic foot ulcer, currently undergoing  HBO.   MANAGEMENT PLAN & GOAL:  I have replaced calcium alginate to the wound,  which has mild-to-moderate drainage.  I did a full-thickness debridement  of this area to remove a mild to moderate amount of eschar.  This was  done without anesthesia and with direct pressure for hemostasis.  We  will reapply the total contact cast and continue his HBO.  All in all,  this wound appears to be doing quite well.           ______________________________  Maxwell Caul, M.D.     MGR/MEDQ  D:  08/12/2006  T:  08/13/2006  Job:  [id number]

## 2010-11-27 NOTE — Assessment & Plan Note (Signed)
[facility]   NAME:  Kevin Monroe, Kevin Monroe              ACCOUNT NO.:  [id number]   MEDICAL RECORD NO.:  [id number]           DATE OF BIRTH:   PHYSICIAN:  Theresia Majors. Tanda Rockers, M.D. VISIT DATE:  08/18/2006                                   OFFICE VISIT   VITAL SIGNS:  Blood pressure is 110/65, respirations are 18, pulse rate  74, temperature is 99.  The capillary blood glucose pre dive was 117  mg%.  The patient was given a protein-fat carb drink prior to his dive,  and post dive, his capillary blood glucose was 227 mg%.  He underwent  his HBO treatment without incident.   PURPOSE OF TODAY'S VISIT:  Kevin Monroe is a 45 year old man who is  undergoing hyperbaric oxygen treatment for a Wagner grade 3 diabetic  foot ulcer.  He has been treated with a total-contact cast for  offloading.  He fractured the cast today, and we elected to remove the  cast, do his wound assessment today, and replace his cast after  tomorrow's HBO treatment.  In the interim, he has denied pain or  malodorous drainage.   WOUND EXAM:  After the dive, we examined the wound.  The cast,  previously placed silver alginate, was removed demonstrating a healthy-  appearing granulating bed.  Areas of callus were sharply debrided.  There was a minimum area of necrosis which was sharply debrided with  resultant hemorrhage control with direct pressure.  The wound was  cleansed with antisept wash and antisept gel was applied and a dry  dressing.  Patient was placed in a Darco healing sandal with an  offloading felt strip.   DIAGNOSIS:  Clinical improvement of a Wagner grade 3 diabetic foot  ulcer.   MANAGEMENT PLAN & GOAL:  We will continue his hyperbaric oxygen  treatment and his total-contact cast, and we will reevaluate him in 5  days.           ______________________________  Theresia Majors Tanda Rockers, M.D.     Kevin Monroe  D:  08/18/2006  T:  08/19/2006  Job:  [id number]

## 2010-11-27 NOTE — Assessment & Plan Note (Signed)
[facility]   NAME:  [name], [name]              ACCOUNT NO.:  [id number]   MEDICAL RECORD NO.:  [id number]      DATE OF BIRTH:  07/19/1965   PHYSICIAN:  Theresia Majors. Tanda Rockers, M.D.      VISIT DATE:                                   OFFICE VISIT   VITAL SIGNS:  Blood pressure is 121/76, respirations 16, pulse rate is  86, temperature 98.1.  Capillary blood glucose is 111 mg%.   PURPOSE OF TODAY'S VISIT:  [name] is a 45 year old man who we follow  for a Wagner grade 4 diabetic foot ulcer.  He has completed a course of  hyperbaric oxygen treatment with complete healing of his wound.  He has  been treated with an offloading healing sandal and has now received a  custom insert.  He returns for followup.  He denies interim recurrence  of his ulceration, pain, or drainage.  His insert is transferable to  multiple shoes.   WOUND EXAM:  Inspection of the foot shows that the ulcer is completely  healed.  Pedal pulses remain intact.  The orthotics appear to be  appropriate and adequate for complete offloading.   DIAGNOSES:  1. Resolved wound.  2. Adequate footwear.   MANAGEMENT GOALS AND PLANS:  Patient is discharged to continue his  followup and routine medical care with Dr. Donette Larry.  We have counseled  the patient regarding routine diabetic foot care, the frequency of foot  inspection, the necessity to have his custom orthotics evaluated and  changed on a cyclical basis.  The patient was given an opportunity to  asked questions, he seems to understand these principles and indicates  that he will be compliant.  He expresses gratitude for having been seen  in the center.  The patient is discharged.      Harold A. Tanda Rockers, M.D.  Electronically Signed     HAN/MEDQ  D:  11/01/2006  T:  11/01/2006  Job:  [id number]   cc:   Georgann Housekeeper, MD

## 2010-11-27 NOTE — Assessment & Plan Note (Signed)
[facility]   NAME:  Kevin Monroe, BOATENG              ACCOUNT NO.:  [id number]   MEDICAL RECORD NO.:  [id number]          DATE OF BIRTH:   PHYSICIAN:  Theresia Majors. Tanda Rockers, M.D. VISIT DATE:  10/06/2006                                   OFFICE VISIT   VITAL SIGNS:  Blood pressure is 108/67, respiration is 14, pulse of 80,  temperature 98.  Capillary blood glucose is 291 mg%.   PURPOSE OF TODAY'S VISIT:  Mr. [name] is a 45 year old man who has  undergone a course of hyperbaric oxygen treatment for a Wegner grade III  diabetic foot ulcer.  The patient resolved the wound.  He is now in an  offloading healing sandal while awaiting a custom shoe.  He denies  interim drainage, malodor, pain or fever.   WOUND EXAM:  Inspection of the left foot shoes that there is absolutely  no wound.  The previous ulcer is completely closed over.  There is no  drainage.  No hyperemia.  We have inspected his healing sandal and it  appears to be adequately constructed.  The pedal pulses are 3+.  There  is no evidence of infection.  He remains in sensate.   DIAGNOSIS:  Resolved Wegner diabetic foot ulcer.   MANAGEMENT PLAN & GOAL:  We will reevaluate the patient in 2 weeks.  We  are encouraging him to continue to wear the offloading healing sandal.  We anticipate that his custom orthotics will be available prior to his  next visit.  We will discharge him once we are confident that he is in  adequate foot wear.      Harold A. Tanda Rockers, M.D.  Electronically Signed     HAN/MEDQ  D:  10/06/2006  T:  10/06/2006  Job:  [id number]

## 2011-04-12 LAB — GLUCOSE, CAPILLARY
Glucose-Capillary: 128 — ABNORMAL HIGH
Glucose-Capillary: 145 — ABNORMAL HIGH
Glucose-Capillary: 173 — ABNORMAL HIGH
Glucose-Capillary: 201 — ABNORMAL HIGH
Glucose-Capillary: 211 — ABNORMAL HIGH
Glucose-Capillary: 215 — ABNORMAL HIGH
Glucose-Capillary: 239 — ABNORMAL HIGH
Glucose-Capillary: 247 — ABNORMAL HIGH
Glucose-Capillary: 97

## 2011-04-12 LAB — CREATININE, SERUM
Creatinine, Ser: 1.19
GFR calc non Af Amer: >60

## 2011-04-13 LAB — GLUCOSE, CAPILLARY
Glucose-Capillary: 123 — ABNORMAL HIGH
Glucose-Capillary: 142 — ABNORMAL HIGH
Glucose-Capillary: 142 — ABNORMAL HIGH
Glucose-Capillary: 196 — ABNORMAL HIGH
Glucose-Capillary: 199 — ABNORMAL HIGH
Glucose-Capillary: 216 — ABNORMAL HIGH

## 2014-10-14 ENCOUNTER — Encounter (HOSPITAL_COMMUNITY): Payer: Self-pay | Admitting: Emergency Medicine

## 2014-10-14 ENCOUNTER — Emergency Department (HOSPITAL_COMMUNITY): Payer: Managed Care, Other (non HMO)

## 2014-10-14 ENCOUNTER — Emergency Department (HOSPITAL_COMMUNITY)
Admission: EM | Admit: 2014-10-14 | Discharge: 2014-10-14 | Disposition: A | Discharge status: Home / Self Care | Payer: Managed Care, Other (non HMO) | Attending: Emergency Medicine | Admitting: Emergency Medicine

## 2014-10-14 DIAGNOSIS — E11649 Type 2 diabetes mellitus with hypoglycemia without coma: Secondary | ICD-10-CM | POA: Insufficient documentation

## 2014-10-14 DIAGNOSIS — Z794 Long term (current) use of insulin: Secondary | ICD-10-CM | POA: Insufficient documentation

## 2014-10-14 DIAGNOSIS — Y9389 Activity, other specified: Secondary | ICD-10-CM | POA: Diagnosis not present

## 2014-10-14 DIAGNOSIS — S20219A Contusion of unspecified front wall of thorax, initial encounter: Secondary | ICD-10-CM | POA: Diagnosis not present

## 2014-10-14 DIAGNOSIS — F329 Major depressive disorder, single episode, unspecified: Secondary | ICD-10-CM | POA: Insufficient documentation

## 2014-10-14 DIAGNOSIS — Y92410 Unspecified street and highway as the place of occurrence of the external cause: Secondary | ICD-10-CM | POA: Diagnosis not present

## 2014-10-14 DIAGNOSIS — Y998 Other external cause status: Secondary | ICD-10-CM | POA: Diagnosis not present

## 2014-10-14 DIAGNOSIS — Z8679 Personal history of other diseases of the circulatory system: Secondary | ICD-10-CM | POA: Diagnosis not present

## 2014-10-14 DIAGNOSIS — Z79899 Other long term (current) drug therapy: Secondary | ICD-10-CM | POA: Diagnosis not present

## 2014-10-14 DIAGNOSIS — S299XXA Unspecified injury of thorax, initial encounter: Secondary | ICD-10-CM | POA: Diagnosis present

## 2014-10-14 DIAGNOSIS — E162 Hypoglycemia, unspecified: Secondary | ICD-10-CM

## 2014-10-14 HISTORY — DX: Type 2 diabetes mellitus without complications: E11.9

## 2014-10-14 HISTORY — DX: Major depressive disorder, single episode, unspecified: F32.9

## 2014-10-14 HISTORY — DX: Dysthymic disorder: F34.1

## 2014-10-14 HISTORY — DX: Depression, unspecified: F32.A

## 2014-10-14 LAB — I-STAT CHEM 8, ED
BUN: 8 mg/dL (ref 6–23)
CHLORIDE: 102 mmol/L (ref 96–112)
Calcium, Ion: 1.11 mmol/L — ABNORMAL LOW (ref 1.12–1.23)
Creatinine, Ser: 1.2 mg/dL (ref 0.50–1.35)
Glucose, Bld: 95 mg/dL (ref 70–99)
HEMATOCRIT: 45 % (ref 39.0–52.0)
HEMOGLOBIN: 15.3 g/dL (ref 13.0–17.0)
POTASSIUM: 3.7 mmol/L (ref 3.5–5.1)
Sodium: 141 mmol/L (ref 135–145)
TCO2: 22 mmol/L (ref 0–100)

## 2014-10-14 LAB — CBG MONITORING, ED: Glucose-Capillary: 129 mg/dL — ABNORMAL HIGH (ref 70–99)

## 2014-10-14 MED ORDER — HYDROCODONE-ACETAMINOPHEN 5-325 MG PO TABS: ORAL_TABLET | ORAL | Status: DC

## 2014-10-14 MED ORDER — TETANUS-DIPHTH-ACELL PERTUSSIS 5-2.5-18.5 LF-MCG/0.5 IM SUSP: 0.5000 mL | Freq: Once | INTRAMUSCULAR | Status: DC

## 2014-10-14 NOTE — ED Notes (Signed)
Bed: [facility] Expected date:  Expected time:  Means of arrival:  Comments: 
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
## 2014-10-14 NOTE — Discharge Instructions (Signed)
Take percocet for breakthrough pain, do not drink alcohol, drive, care for children or do other critical tasks while taking percocet.   It is very important that you take deep breaths to prevent lung collapse and infection.  Take 10 deep breaths every hour to prevent lung collapse.  If you develop cough, fever or shortness of breath return immediately to the emergency room.  Please follow with your primary care doctor in the next 2 days for a check-up. They must obtain records for further management.   Do not hesitate to return to the Emergency Department for any new, worsening or concerning symptoms.   Chest Contusion A chest contusion is a deep bruise on your chest area. Contusions are the result of an injury that caused bleeding under the skin. A chest contusion may involve bruising of the skin, muscles, or ribs. The contusion may turn blue, purple, or yellow. Minor injuries will give you a painless contusion, but more severe contusions may stay painful and swollen for a few weeks. CAUSES  A contusion is usually caused by a blow, trauma, or direct force to an area of the body. SYMPTOMS   Swelling and redness of the injured area.  Discoloration of the injured area.  Tenderness and soreness of the injured area.  Pain. DIAGNOSIS  The diagnosis can be made by taking a history and performing a physical exam. An X-ray, CT scan, or MRI may be needed to determine if there were any associated injuries, such as broken bones (fractures) or internal injuries. TREATMENT  Often, the best treatment for a chest contusion is resting, icing, and applying cold compresses to the injured area. Deep breathing exercises may be recommended to reduce the risk of pneumonia. Over-the-counter medicines may also be recommended for pain control. HOME CARE INSTRUCTIONS   Put ice on the injured area.  Put ice in a plastic bag.  Place a towel between your skin and the bag.  Leave the ice on for 15-20 minutes,  03-04 times a day.  Only take over-the-counter or prescription medicines as directed by your caregiver. Your caregiver may recommend avoiding anti-inflammatory medicines (aspirin, ibuprofen, and naproxen) for 48 hours because these medicines may increase bruising.  Rest the injured area.  Perform deep-breathing exercises as directed by your caregiver.  Stop smoking if you smoke.  Do not lift objects over 5 pounds (2.3 kg) for 3 days or longer if recommended by your caregiver. SEEK IMMEDIATE MEDICAL CARE IF:   You have increased bruising or swelling.  You have pain that is getting worse.  You have difficulty breathing.  You have dizziness, weakness, or fainting.  You have blood in your urine or stool.  You cough up or vomit blood.  Your swelling or pain is not relieved with medicines. MAKE SURE YOU:   Understand these instructions.  Will watch your condition.  Will get help right away if you are not doing well or get worse. Document Released: 03/23/2001 Document Revised: 03/22/2012 Document Reviewed: 12/20/2011 [facility] Patient Information 2015 [place], [place]. This information is not intended to replace advice given to you by your health care provider. Make sure you discuss any questions you have with your health care provider.  Low Blood Sugar Low blood sugar (hypoglycemia) means that the level of sugar in your blood is lower than it should be. Signs of low blood sugar include:  Getting sweaty.  Feeling hungry.  Feeling dizzy or weak.  Feeling sleepier than normal.  Feeling nervous.  Headaches.  Having  a fast heartbeat. Low blood sugar can happen fast and can be an emergency. Your doctor can do tests to check your blood sugar level. You can have low blood sugar and not have diabetes. HOME CARE  Check your blood sugar as told by your doctor. If it is less than 70 mg/dl or as told by your doctor, take 1 of the following:  3 to 4 glucose tablets.   cup clear  juice.   cup soda pop, not diet.  1 cup milk.  5 to 6 hard candies.  Recheck blood sugar after 15 minutes. Repeat until it is at the right level.  Eat a snack if it is more than 1 hour until the next meal.  Only take medicine as told by your doctor.  Do not skip meals. Eat on time.  Do not drink alcohol except with meals.  Check your blood glucose before driving.  Check your blood glucose before and after exercise.  Always carry treatment with you, such as glucose pills.  Always wear a medical alert bracelet if you have diabetes. GET HELP RIGHT AWAY IF:   Your blood glucose goes below 70 mg/dl or as told by your doctor, and you:  Are confused.  Are not able to swallow.  Pass out (faint).  You cannot treat yourself. You may need someone to help you.  You have low blood sugar problems often.  You have problems from your medicines.  You are not feeling better after 3 to 4 days.  You have vision changes. MAKE SURE YOU:   Understand these instructions.  Will watch this condition.  Will get help right away if you are not doing well or get worse. Document Released: 09/22/2009 Document Revised: 09/20/2011 Document Reviewed: 09/22/2009 [facility] Patient Information 2015 [place], [place]. This information is not intended to replace advice given to you by your health care provider. Make sure you discuss any questions you have with your health care provider.

## 2014-10-15 ENCOUNTER — Other Ambulatory Visit: Payer: Self-pay | Admitting: Internal Medicine

## 2014-10-15 ENCOUNTER — Ambulatory Visit
Admission: RE | Admit: 2014-10-15 | Discharge: 2014-10-15 | Disposition: A | Discharge status: Home / Self Care | Payer: Managed Care, Other (non HMO) | Source: Ambulatory Visit | Attending: Internal Medicine | Admitting: Internal Medicine

## 2014-10-15 DIAGNOSIS — R0789 Other chest pain: Secondary | ICD-10-CM

## 2015-08-27 ENCOUNTER — Emergency Department (HOSPITAL_COMMUNITY): Payer: Managed Care, Other (non HMO)

## 2015-08-27 ENCOUNTER — Emergency Department (HOSPITAL_COMMUNITY)
Admission: EM | Admit: 2015-08-27 | Discharge: 2015-08-28 | Disposition: A | Discharge status: Home / Self Care | Payer: Managed Care, Other (non HMO) | Attending: Emergency Medicine | Admitting: Emergency Medicine

## 2015-08-27 ENCOUNTER — Encounter (HOSPITAL_COMMUNITY): Payer: Self-pay | Admitting: Emergency Medicine

## 2015-08-27 DIAGNOSIS — K8020 Calculus of gallbladder without cholecystitis without obstruction: Secondary | ICD-10-CM | POA: Diagnosis not present

## 2015-08-27 DIAGNOSIS — Z794 Long term (current) use of insulin: Secondary | ICD-10-CM | POA: Diagnosis not present

## 2015-08-27 DIAGNOSIS — K8050 Calculus of bile duct without cholangitis or cholecystitis without obstruction: Secondary | ICD-10-CM

## 2015-08-27 DIAGNOSIS — E119 Type 2 diabetes mellitus without complications: Secondary | ICD-10-CM | POA: Insufficient documentation

## 2015-08-27 DIAGNOSIS — F329 Major depressive disorder, single episode, unspecified: Secondary | ICD-10-CM | POA: Insufficient documentation

## 2015-08-27 DIAGNOSIS — Z9049 Acquired absence of other specified parts of digestive tract: Secondary | ICD-10-CM | POA: Diagnosis not present

## 2015-08-27 DIAGNOSIS — Z79899 Other long term (current) drug therapy: Secondary | ICD-10-CM | POA: Insufficient documentation

## 2015-08-27 DIAGNOSIS — R109 Unspecified abdominal pain: Secondary | ICD-10-CM | POA: Diagnosis present

## 2015-08-27 LAB — COMPREHENSIVE METABOLIC PANEL
ALT: 29 U/L (ref 17–63)
ANION GAP: 11 (ref 5–15)
AST: 25 U/L (ref 15–41)
Albumin: 4.8 g/dL (ref 3.5–5.0)
Alkaline Phosphatase: 68 U/L (ref 38–126)
BUN: 15 mg/dL (ref 6–20)
CHLORIDE: 100 mmol/L — AB (ref 101–111)
CO2: 25 mmol/L (ref 22–32)
Calcium: 9.3 mg/dL (ref 8.9–10.3)
Creatinine, Ser: 1.07 mg/dL (ref 0.61–1.24)
GFR calc non Af Amer: >60 mL/min (ref >60)
Glucose, Bld: 213 mg/dL — ABNORMAL HIGH (ref 65–99)
Potassium: 4.4 mmol/L (ref 3.5–5.1)
Sodium: 136 mmol/L (ref 135–145)
Total Bilirubin: 1.5 mg/dL — ABNORMAL HIGH (ref 0.3–1.2)
Total Protein: 7.4 g/dL (ref 6.5–8.1)

## 2015-08-27 LAB — URINALYSIS, ROUTINE W REFLEX MICROSCOPIC
BILIRUBIN URINE: NEGATIVE
Glucose, UA: 100 mg/dL — AB
HGB URINE DIPSTICK: NEGATIVE
KETONES UR: 15 mg/dL — AB
Leukocytes, UA: NEGATIVE
NITRITE: NEGATIVE
PH: 8.5 — AB (ref 5.0–8.0)
Protein, ur: 100 mg/dL — AB
Specific Gravity, Urine: 1.025 (ref 1.005–1.030)

## 2015-08-27 LAB — CBC
HCT: 46.2 % (ref 39.0–52.0)
Hemoglobin: 16.2 g/dL (ref 13.0–17.0)
MCH: 32.1 pg (ref 26.0–34.0)
MCHC: 35.1 g/dL (ref 30.0–36.0)
MCV: 91.7 fL (ref 78.0–100.0)
PLATELETS: 189 10*3/uL (ref 150–400)
RBC: 5.04 MIL/uL (ref 4.22–5.81)
RDW: 11.8 % (ref 11.5–15.5)
WBC: 14.8 10*3/uL — ABNORMAL HIGH (ref 4.0–10.5)

## 2015-08-27 LAB — LIPASE, BLOOD: LIPASE: 25 U/L (ref 11–51)

## 2015-08-27 LAB — URINE MICROSCOPIC-ADD ON
RBC / HPF: NONE SEEN RBC/hpf (ref 0–5)
WBC UA: NONE SEEN WBC/hpf (ref 0–5)

## 2015-08-27 MED ORDER — SODIUM CHLORIDE 0.9 % IV BOLUS (SEPSIS)
1000.0000 mL | Freq: Once | INTRAVENOUS | Status: AC
  Administered 2015-08-27: 1000 mL via INTRAVENOUS

## 2015-08-27 MED ORDER — ONDANSETRON HCL 4 MG/2ML IJ SOLN
4.0000 mg | Freq: Once | INTRAMUSCULAR | Status: AC
  Administered 2015-08-27: 4 mg via INTRAVENOUS
  Filled 2015-08-27: qty 2

## 2015-08-27 MED ORDER — MORPHINE SULFATE (PF) 4 MG/ML IV SOLN
4.0000 mg | Freq: Once | INTRAVENOUS | Status: AC
  Administered 2015-08-27: 4 mg via INTRAVENOUS
  Filled 2015-08-27: qty 1

## 2015-08-27 NOTE — ED Notes (Addendum)
Patient presents for 4 episodes of emesis and RLQ abdominal pain PTA. Denies fever/chills, diarrhea or urinary symptoms. Rates pain 7/10.
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
## 2015-08-28 MED ORDER — OXYCODONE-ACETAMINOPHEN 5-325 MG PO TABS: ORAL_TABLET | ORAL | Status: DC

## 2015-08-28 NOTE — ED Notes (Signed)
Pt ate sandwich and drank water without problem

## 2015-08-28 NOTE — Discharge Instructions (Signed)
Take percocet for breakthrough pain, do not drink alcohol, drive, care for children or do other critical tasks while taking percocet.  Please follow with your primary care doctor in the next 2 days for a check-up. They must obtain records for further management.   Do not hesitate to return to the Emergency Department for any new, worsening or concerning symptoms.    Cholelithiasis Cholelithiasis (also called gallstones) is a form of gallbladder disease in which gallstones form in your gallbladder. The gallbladder is an organ that stores bile made in the liver, which helps digest fats. Gallstones begin as small crystals and slowly grow into stones. Gallstone pain occurs when the gallbladder spasms and a gallstone is blocking the duct. Pain can also occur when a stone passes out of the duct.  RISK FACTORS  Being male.   Having multiple pregnancies. Health care providers sometimes advise removing diseased gallbladders before future pregnancies.   Being obese.  Eating a diet heavy in fried foods and fat.   Being older than 60 years and increasing age.   Prolonged use of medicines containing male hormones.   Having diabetes mellitus.   Rapidly losing weight.   Having a family history of gallstones (heredity).  SYMPTOMS  Nausea.   Vomiting.  Abdominal pain.   Yellowing of the skin (jaundice).   Sudden pain. It may persist from several minutes to several hours.  Fever.   Tenderness to the touch. In some cases, when gallstones do not move into the bile duct, people have no pain or symptoms. These are called "silent" gallstones.  TREATMENT Silent gallstones do not need treatment. In severe cases, emergency surgery may be required. Options for treatment include:  Surgery to remove the gallbladder. This is the most common treatment.  Medicines. These do not always work and may take 6-12 months or more to work.  Shock wave treatment (extracorporeal biliary  lithotripsy). In this treatment an ultrasound machine sends shock waves to the gallbladder to break gallstones into smaller pieces that can pass into the intestines or be dissolved by medicine. HOME CARE INSTRUCTIONS   Only take over-the-counter or prescription medicines for pain, discomfort, or fever as directed by your health care provider.   Follow a low-fat diet until seen again by your health care provider. Fat causes the gallbladder to contract, which can result in pain.   Follow up with your health care provider as directed. Attacks are almost always recurrent and surgery is usually required for permanent treatment.  SEEK IMMEDIATE MEDICAL CARE IF:   Your pain increases and is not controlled by medicines.   You have a fever or persistent symptoms for more than 2-3 days.   You have a fever and your symptoms suddenly get worse.   You have persistent nausea and vomiting.  MAKE SURE YOU:   Understand these instructions.  Will watch your condition.  Will get help right away if you are not doing well or get worse.   This information is not intended to replace advice given to you by your health care provider. Make sure you discuss any questions you have with your health care provider.   Document Released: 06/24/2005 Document Revised: 02/28/2013 Document Reviewed: 12/20/2012 Elsevier Interactive Patient Education [employer]

## 2015-09-03 ENCOUNTER — Ambulatory Visit: Payer: Self-pay | Admitting: General Surgery

## 2015-09-03 NOTE — H&P (Signed)
History of Present Illness Kevin Filler MD; 09/03/2015 3:55 PM) The patient is a 50 year old male who presents for evaluation of gall stones. The patient is a 50 year old male with symptomatic cholelithiasis patient was referred by Dr. Tyson Dense.  Patient states that several weeks ago he had some abdominal pain/nausea vomiting. Pain continued and patient was seen in the ER. Patient underwent ultrasound revealed multiple gallstones. Patient states that time his changed his diet to low-fat diet which has caused a decrease in abdominal pain.   Other Problems Fay Records, CMA; 09/03/2015 3:27 PM) Depression Diabetes Mellitus  Past Surgical History Fay Records, CMA; 09/03/2015 3:27 PM) Appendectomy Foot Surgery Left.  Diagnostic Studies History Fay Records, [place]; 09/03/2015 3:27 PM) Colonoscopy never  Allergies Fay Records, CMA; 09/03/2015 3:27 PM) No Known Drug Allergies02/22/2017  Medication History Fay Records, CMA; 09/03/2015 3:28 PM) Simvastatin (  Tablet, Oral) Active. Lisinopril (  Tablet, Oral) Active. Oxycodone-Acetaminophen (5-325MG  Tablet, Oral) Active. NovoLIN R (100UNIT/ML Solution, Injection) Active. Medications Reconciled  Social History Fay Records, [place]; 09/03/2015 3:27 PM) Alcohol use Moderate alcohol use. Caffeine use Coffee. No drug use Tobacco use Never smoker.  Family History Fay Records, [place]; 09/03/2015 3:27 PM) Alcohol Abuse Father. Arthritis Family Members In General, Mother, Sister. Breast Cancer Family Members In General. Cancer Family Members In General. Cerebrovascular Accident Family Members In General. Depression Family Members In General, Father, Mother. Diabetes Mellitus Father, Mother, Sister. Heart Disease Family Members In General. Heart disease in male family member before age 39 Hypertension Family Members In General. Kidney Disease Sister. Seizure disorder Daughter. Thyroid problems  Mother.    Review of Systems Fay Records CMA; 09/03/2015 3:27 PM) General Present- Night Sweats and Weight Loss. Not Present- Appetite Loss, Chills, Fatigue, Fever and Weight Gain. Skin Not Present- Change in Wart/Mole, Dryness, Hives, Jaundice, New Lesions, Non-Healing Wounds, Rash and Ulcer. HEENT Not Present- Earache, Hearing Loss, Hoarseness, Nose Bleed, Oral Ulcers, Ringing in the Ears, Seasonal Allergies, Sinus Pain, Sore Throat, Visual Disturbances, Wears glasses/contact lenses and Yellow Eyes. Respiratory Not Present- Bloody sputum, Chronic Cough, Difficulty Breathing, Snoring and Wheezing. Breast Not Present- Breast Mass, Breast Pain, Nipple Discharge and Skin Changes. Cardiovascular Not Present- Chest Pain, Difficulty Breathing Lying Down, Leg Cramps, Palpitations, Rapid Heart Rate, Shortness of Breath and Swelling of Extremities. Gastrointestinal Present- Abdominal Pain. Not Present- Bloating, Bloody Stool, Change in Bowel Habits, Chronic diarrhea, Constipation, Difficulty Swallowing, Excessive gas, Gets full quickly at meals, Hemorrhoids, Indigestion, Nausea, Rectal Pain and Vomiting. Male Genitourinary Not Present- Blood in Urine, Change in Urinary Stream, Frequency, Impotence, Nocturia, Painful Urination, Urgency and Urine Leakage. Musculoskeletal Not Present- Back Pain, Joint Pain, Joint Stiffness, Muscle Pain, Muscle Weakness and Swelling of Extremities. Neurological Not Present- Decreased Memory, Fainting, Headaches, Numbness, Seizures, Tingling, Tremor, Trouble walking and Weakness. Psychiatric Present- Depression. Not Present- Anxiety, Bipolar, Change in Sleep Pattern, Fearful and Frequent crying. Endocrine Not Present- Cold Intolerance, Excessive Hunger, Hair Changes, Heat Intolerance, Hot flashes and New Diabetes. Hematology Not Present- Easy Bruising, Excessive bleeding, Gland problems, HIV and Persistent Infections.  Vitals Fay Records CMA; 09/03/2015 3:28 PM) 09/03/2015  3:28 PM Weight: 216.8 lb Height: 73in Body Surface Area: 2.23 m Body Mass Index: 28.6 kg/m  Temp.: 97.61F(Temporal)  Pulse: 73 (Regular)  BP: 130/70 (Sitting, Left Arm, Standard)       Physical Exam Kevin Filler, MD; 09/03/2015 3:56 PM) General Mental Status-Alert. General Appearance-Consistent with stated age. Hydration-Well hydrated. Voice-Normal.  Head and Neck Head-normocephalic, atraumatic with no lesions or palpable masses.  Eye Eyeball - Bilateral-Extraocular movements intact. Sclera/Conjunctiva - Bilateral-No scleral icterus.  Chest and Lung Exam Chest and lung exam reveals -quiet, even and easy respiratory effort with no use of accessory muscles. Inspection Chest Wall - Normal. Back - normal.  Cardiovascular Cardiovascular examination reveals -normal heart sounds, regular rate and rhythm with no murmurs.  Abdomen Inspection Normal Exam - No Hernias. Palpation/Percussion Normal exam - Soft, Non Tender, No Rebound tenderness, No Rigidity (guarding) and No hepatosplenomegaly. Auscultation Normal exam - Bowel sounds normal.  Neurologic Neurologic evaluation reveals -alert and oriented x 3 with no impairment of recent or remote memory. Mental Status-Normal.  Musculoskeletal Normal Exam - Left-Upper Extremity Strength Normal and Lower Extremity Strength Normal. Normal Exam - Right-Upper Extremity Strength Normal, Lower Extremity Weakness.    Assessment & Plan Kevin Filler MD; 09/03/2015 3:55 PM) SYMPTOMATIC CHOLELITHIASIS (K80.20) Impression: 50 year old male with symptomatic cholelithiasis  1. 1. We will proceed to the operating room for a laparoscopic cholecystectomy 2. Risks and benefits were discussed with the patient to generally include, but not limited to: infection, bleeding, possible need for post op ERCP, damage to the bile ducts, bile leak, and possible need for further surgery. Alternatives were  offered and described. All questions were answered and the patient voiced understanding of the procedure and wishes to proceed at this point with a laparoscopic cholecystectomy

## 2016-07-22 ENCOUNTER — Ambulatory Visit (INDEPENDENT_AMBULATORY_CARE_PROVIDER_SITE_OTHER): Payer: Managed Care, Other (non HMO) | Admitting: Family

## 2016-07-22 DIAGNOSIS — L02611 Cutaneous abscess of right foot: Secondary | ICD-10-CM | POA: Diagnosis not present

## 2016-07-22 DIAGNOSIS — L02612 Cutaneous abscess of left foot: Secondary | ICD-10-CM | POA: Diagnosis not present

## 2016-07-22 NOTE — Progress Notes (Signed)
   Office Visit Note   Patient: Kevin Monroe           Date of Birth: 09-Dec-1965           MRN: [id number] Visit Date: 07/22/2016              Requested by: Georgann HousekeeperKarrar Husain, MD [address] [place], [place] [address] PCP: Georgann HousekeeperHUSAIN,KARRAR, MD  No chief complaint on file.   HPI: The patient is a 51 year old gentleman who is seen today for evaluation of a wound infection to his left great toe plantar aspect. States the ulcer started just yesterday as a blister. there has been purulent drainage.   Was seen earlier today by his primary care physician who has prescribed Keflex.   Of note has a history of bilateral great toe amputations.    Assessment & Plan: Visit Diagnoses:  1. Cutaneous abscess of right foot     Plan: We'll complete the course of Keflex. Has been given a Darco shoe today. He will minimize weightbearing, use a Darco shoe to offload the forefoot. Cleanse wound daily apply antibacterial ointment and a dressing.  Follow-Up Instructions: Return in about 2 weeks (around 08/05/2016).   Ortho Exam Patient is alert and oriented. No adenopathy. Well-dressed. Normal affect. Respirations easy. Left foot: Beneath the residual left great toe there is ulceration and purulent drainage. Nonviable tissue was debrided with a 10 blade knife back to viable tissue. Underlying ulcer is 2 cm in diameter with red tissue in wound bed. No surrounding erythema or sign of cellulitis.    Imaging: No results found.  Orders:  No orders of the defined types were placed in this encounter.  No orders of the defined types were placed in this encounter.    Procedures: No procedures performed  Clinical Data: No additional findings.  Subjective: Review of Systems  Constitutional: Positive for chills. Negative for fever.  Skin: Positive for wound.    Objective: Vital Signs: There were no vitals taken for this visit.  Specialty Comments:  No specialty comments  available.  PMFS History: There are no active problems to display for this patient.  Past Medical History:  Diagnosis Date  . Depression   . Diabetes mellitus without complication (HCC)   . Dysthymia     No family history on file.  Past Surgical History:  Procedure Laterality Date  . APPENDECTOMY    . TOE AMPUTATION Left    Social History   Occupational History  . Not on file.   Social History Main Topics  . Smoking status: Never Smoker  . Smokeless tobacco: Not on file  . Alcohol use No  . Drug use: Unknown  . Sexual activity: Not on file

## 2016-07-29 ENCOUNTER — Ambulatory Visit (INDEPENDENT_AMBULATORY_CARE_PROVIDER_SITE_OTHER): Payer: Self-pay | Admitting: Orthopedic Surgery

## 2016-08-06 ENCOUNTER — Ambulatory Visit (INDEPENDENT_AMBULATORY_CARE_PROVIDER_SITE_OTHER): Payer: Managed Care, Other (non HMO) | Admitting: Family

## 2016-08-06 DIAGNOSIS — L97521 Non-pressure chronic ulcer of other part of left foot limited to breakdown of skin: Secondary | ICD-10-CM

## 2016-08-06 DIAGNOSIS — L02612 Cutaneous abscess of left foot: Secondary | ICD-10-CM

## 2016-08-06 NOTE — Progress Notes (Signed)
   Office Visit Note   Patient: Kevin HolmRichard A Monroe           Date of Birth: 1965-09-08           MRN: [id number] Visit Date: 08/06/2016              Requested by: Georgann HousekeeperKarrar Husain, MD [address] [place], [place] [address] PCP: Georgann HousekeeperHUSAIN,KARRAR, MD   Assessment & Plan: Visit Diagnoses:  1. Ulcer of toe of left foot, limited to breakdown of skin (HCC)   2. Cutaneous abscess of left foot     Plan: The patient will follow-up in office as needed. States he will remain out of work for 1 more week. Continue the Darco for 1 more week. Of course, he'll continue with daily foot checks.  Follow-Up Instructions: Return if symptoms worsen or fail to improve.   Orders:  No orders of the defined types were placed in this encounter.  No orders of the defined types were placed in this encounter.     Procedures: No procedures performed   Clinical Data: No additional findings.   Subjective: Chief Complaint  Patient presents with  . Left Foot - Follow-up    Kevin Monroe is a 51 year old gentleman who is seen today in follow up for left foot abscess. He states he is doing very well. Walking [facility]. Has been doing antibacterial ointment dressing changes. Has been doing light work. Pleased with his progress. Completed an oral antibiotic course.    Review of Systems  Constitutional: Negative for chills and fever.  Skin: Negative for wound.     Objective: Vital Signs: There were no vitals taken for this visit.  Physical Exam  Ortho Exam Physical Exam  Constitutional: Appears well-developed.  Head: Normocephalic.  Eyes: EOM are normal.  Neck: Normal range of motion.  Cardiovascular: Normal rate.   Pulmonary/Chest: Effort normal.  Neurological: Is alert.  Skin: Skin is warm.  Psychiatric: Has a normal mood and affect. Left foot: the left great toe is surgically absent. No swelling. Beneath the First mtP joint on the left there is a healed ulcer. There is no open  area, no drainage no erythema and no odor no sign of infection.  Specialty Comments:  No specialty comments available.  Imaging: No results found.   PMFS History: There are no active problems to display for this patient.  Past Medical History:  Diagnosis Date  . Depression   . Diabetes mellitus without complication (HCC)   . Dysthymia     No family history on file.  Past Surgical History:  Procedure Laterality Date  . APPENDECTOMY    . TOE AMPUTATION Left    Social History   Occupational History  . Not on file.   Social History Main Topics  . Smoking status: Never Smoker  . Smokeless tobacco: Not on file  . Alcohol use No  . Drug use: Unknown  . Sexual activity: Not on file

## 2016-09-02 ENCOUNTER — Other Ambulatory Visit: Payer: Self-pay | Admitting: Gastroenterology

## 2016-11-25 ENCOUNTER — Ambulatory Visit
Admission: RE | Admit: 2016-11-25 | Discharge: 2016-11-25 | Disposition: A | Discharge status: Home / Self Care | Payer: Managed Care, Other (non HMO) | Source: Ambulatory Visit | Attending: Internal Medicine | Admitting: Internal Medicine

## 2016-11-25 ENCOUNTER — Other Ambulatory Visit: Payer: Self-pay | Admitting: Internal Medicine

## 2016-11-25 DIAGNOSIS — L089 Local infection of the skin and subcutaneous tissue, unspecified: Secondary | ICD-10-CM

## 2016-12-13 ENCOUNTER — Encounter (HOSPITAL_COMMUNITY): Payer: Self-pay

## 2016-12-13 ENCOUNTER — Ambulatory Visit (HOSPITAL_COMMUNITY): Admit: 2016-12-13 | Payer: Managed Care, Other (non HMO) | Admitting: Gastroenterology

## 2016-12-13 SURGERY — COLONOSCOPY WITH PROPOFOL
Anesthesia: Monitor Anesthesia Care

## 2016-12-17 ENCOUNTER — Encounter: Payer: Self-pay | Admitting: Podiatry

## 2016-12-17 ENCOUNTER — Ambulatory Visit (INDEPENDENT_AMBULATORY_CARE_PROVIDER_SITE_OTHER): Payer: Managed Care, Other (non HMO) | Admitting: Podiatry

## 2016-12-17 DIAGNOSIS — B353 Tinea pedis: Secondary | ICD-10-CM

## 2016-12-17 DIAGNOSIS — L089 Local infection of the skin and subcutaneous tissue, unspecified: Secondary | ICD-10-CM

## 2016-12-17 MED ORDER — NAFTIFINE HCL 2 % EX GEL: 1.0000 "application " | Freq: Every day | CUTANEOUS | 1 refills | Status: DC

## 2016-12-17 MED ORDER — TERBINAFINE HCL 250 MG PO TABS: 250.0000 mg | ORAL_TABLET | Freq: Every day | ORAL | 0 refills | Status: DC

## 2016-12-17 NOTE — Progress Notes (Signed)
Subjective:    Patient ID: Pricilla Holm, male    DOB: 16-Aug-1965, 51 y.o.   MRN: [id number]  HPI  51 year old male presents the office they for concerns of a skin rash to his right foot which has been ongoing for 2 weeks. He did see his primary care physician he was given daily, saw as well as antibiotic as well as fluconazole. He states this did not help is actually the foot is worse. He denies any drainage or pus or any red streaking. He states that there is redness in between his toes as well as dry skin. He has no other complaints. Denies any systemic complaints such as fevers, chills, nausea, vomiting.    Review of Systems  All other systems reviewed and are negative.      Objective:   Physical Exam General: AAO x3, NAD  Dermatological: There is evidence of significant tinea pedis present on the interspaces and right foot. There is dry, erythematous skin interdigitally with macerated tissue. Mild swelling to the toes but there is no ascending synovitis. There is no fluctuance or crepitus. There is no drainage or pus expressed. The nails are very dystrophic, discolored. There is no open lesions identified otherwise.  Vascular: Dorsalis Pedis artery and Posterior Tibial artery pedal pulses are 2/4 bilateral with immedate capillary fill time. There is no pain with calf compression, swelling, warmth, erythema.   Neruologic: Grossly intact via light touch bilateral. Vibratory intact via tuning fork bilateral. Protective threshold with Semmes Wienstein monofilament intact to all pedal sites bilateral.   Musculoskeletal: No gross boney pedal deformities bilateral. No pain, crepitus, or limitation noted with foot and ankle range of motion bilateral. Muscular strength 5/5 in all groups tested bilateral.  Gait: Unassisted, Nonantalgic.      Assessment & Plan:  51 year old male with significant tinea pedis right foot -Treatment options discussed including all alternatives, risks, and  complications -Etiology of symptoms were discussed -Lamisil x 2 weeks. Discussed side effects. -Naftin gel ordered -Dry thoroughly between digits. -Discussed shoe and sock modifications and changes. -Follow-up as scheduled or sooner if needed.  Ovid Curd, DPM

## 2016-12-21 DIAGNOSIS — B353 Tinea pedis: Secondary | ICD-10-CM | POA: Insufficient documentation

## 2016-12-23 ENCOUNTER — Telehealth: Payer: Self-pay | Admitting: *Deleted

## 2016-12-23 MED ORDER — NAFTIFINE HCL 2 % EX CREA: TOPICAL_CREAM | CUTANEOUS | 1 refills | Status: DC

## 2016-12-23 NOTE — Telephone Encounter (Signed)
Received request for alternative to Naftin gel, offered alternatives include Naftifine HCL, Ciclopirox, Clotrimazole nitrate, ketoconazole, Oxiconazole nitrate. Dr. Ardelle AntonWagoner ordered Naftifine 2% cream. Escribed to [facility].

## 2016-12-31 ENCOUNTER — Ambulatory Visit: Payer: Managed Care, Other (non HMO) | Admitting: Podiatry

## 2017-02-24 ENCOUNTER — Ambulatory Visit: Payer: Managed Care, Other (non HMO) | Admitting: Podiatry

## 2017-06-14 ENCOUNTER — Other Ambulatory Visit: Payer: Self-pay | Admitting: Internal Medicine

## 2017-06-14 DIAGNOSIS — R600 Localized edema: Secondary | ICD-10-CM

## 2017-06-15 ENCOUNTER — Ambulatory Visit
Admission: RE | Admit: 2017-06-15 | Discharge: 2017-06-15 | Disposition: A | Discharge status: Home / Self Care | Payer: Managed Care, Other (non HMO) | Source: Ambulatory Visit | Attending: Internal Medicine | Admitting: Internal Medicine

## 2017-06-15 DIAGNOSIS — R600 Localized edema: Secondary | ICD-10-CM

## 2017-06-17 ENCOUNTER — Ambulatory Visit (HOSPITAL_COMMUNITY): Payer: Managed Care, Other (non HMO) | Attending: Cardiovascular Disease

## 2017-06-17 ENCOUNTER — Other Ambulatory Visit: Payer: Self-pay | Admitting: Internal Medicine

## 2017-06-17 ENCOUNTER — Other Ambulatory Visit: Payer: Self-pay

## 2017-06-17 ENCOUNTER — Other Ambulatory Visit (HOSPITAL_COMMUNITY): Payer: Self-pay | Admitting: Internal Medicine

## 2017-06-17 DIAGNOSIS — R0602 Shortness of breath: Secondary | ICD-10-CM

## 2017-07-26 DIAGNOSIS — E1042 Type 1 diabetes mellitus with diabetic polyneuropathy: Secondary | ICD-10-CM | POA: Diagnosis not present

## 2017-07-26 DIAGNOSIS — Z125 Encounter for screening for malignant neoplasm of prostate: Secondary | ICD-10-CM | POA: Diagnosis not present

## 2017-07-26 DIAGNOSIS — E7800 Pure hypercholesterolemia, unspecified: Secondary | ICD-10-CM | POA: Diagnosis not present

## 2017-07-26 DIAGNOSIS — Z0000 Encounter for general adult medical examination without abnormal findings: Secondary | ICD-10-CM | POA: Diagnosis not present

## 2017-12-10 IMAGING — US US ABDOMEN COMPLETE
1 series · 14 of 25 positions shown · non-contrast
Comparison: None.

CLINICAL DATA: Right upper quadrant pain and emesis today.

EXAM:
ABDOMEN ULTRASOUND COMPLETE

[Series 1: us abdomen complete · 0.28mm/px · 14 of 87 slices shown]
[im 1/87]
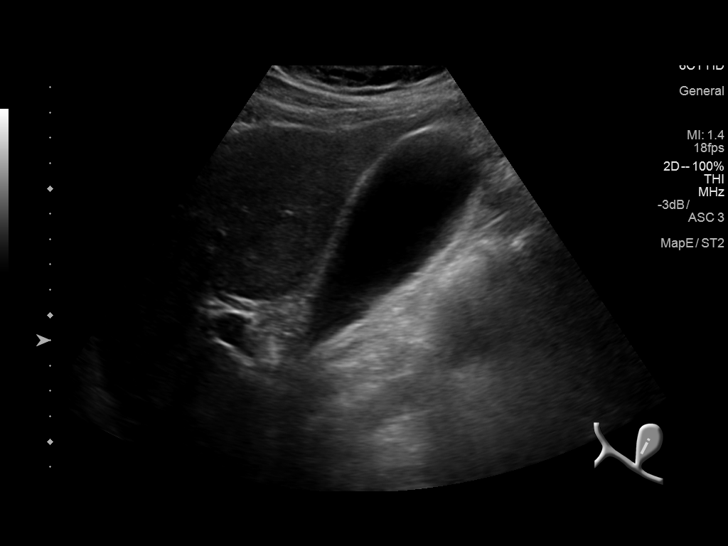
[im 8/87]
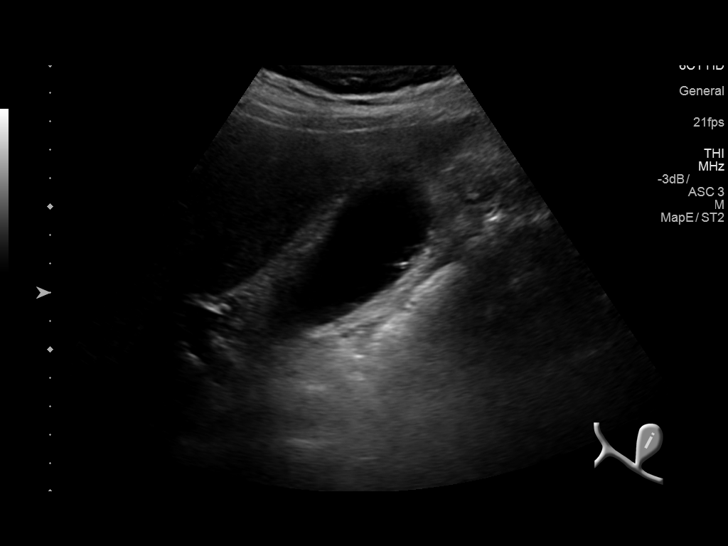
[im 15/87]
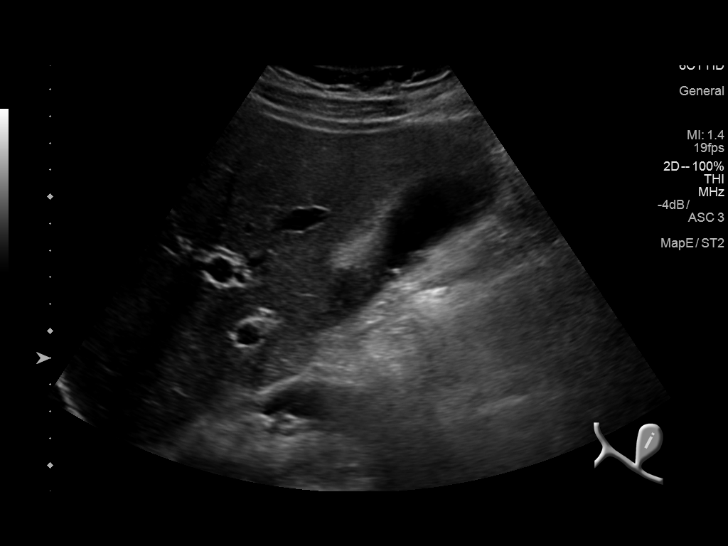
[im 22/87]
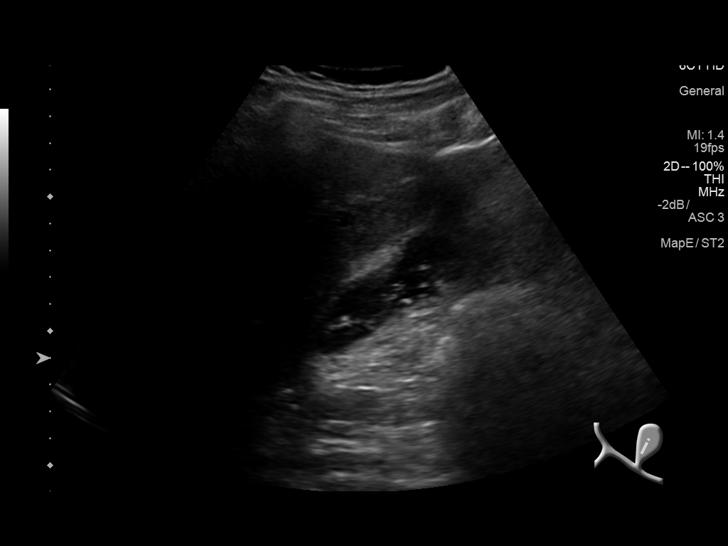
[im 29/87]
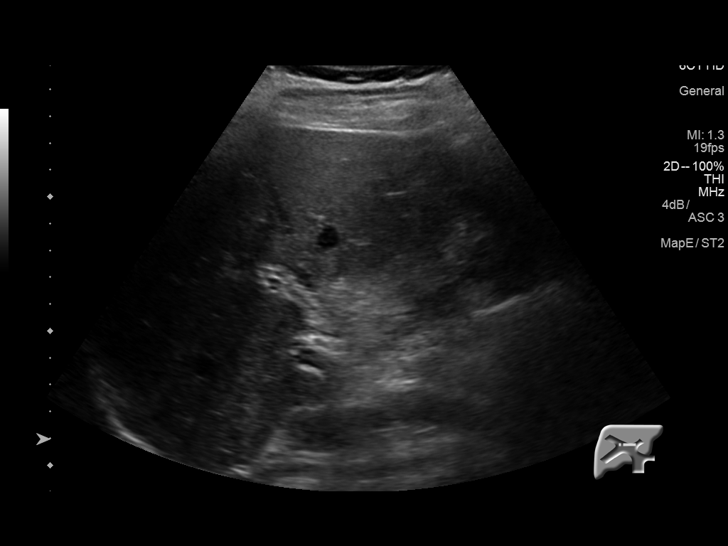
[im 33/87]
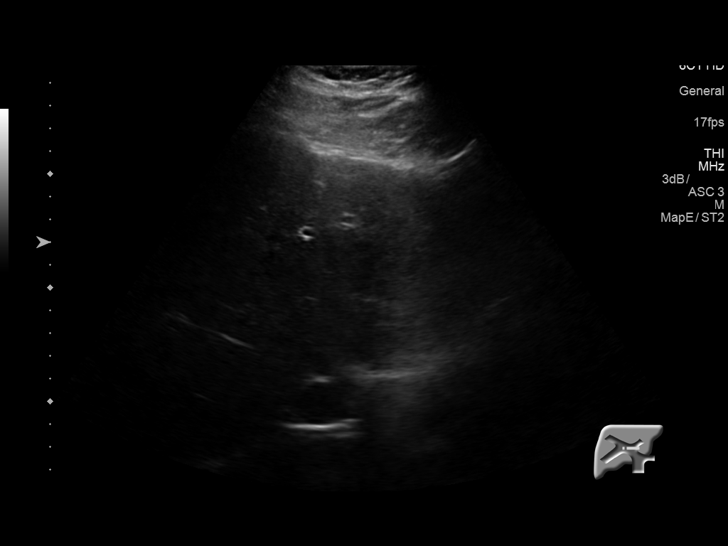
[im 40/87]
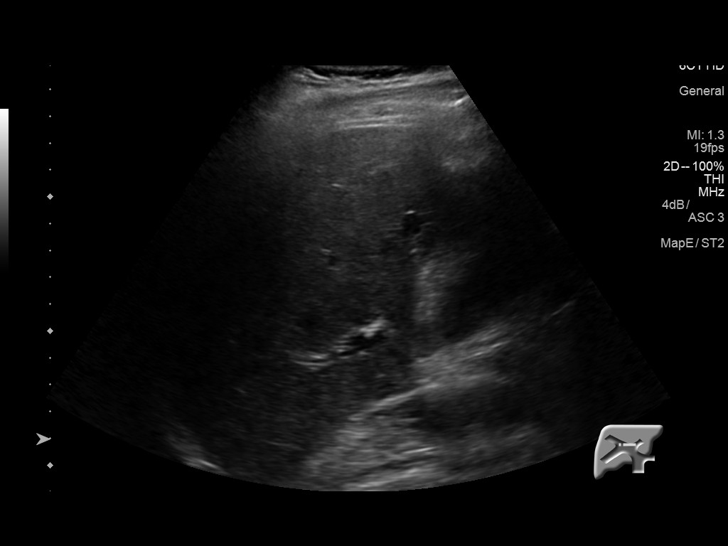
[im 47/87]
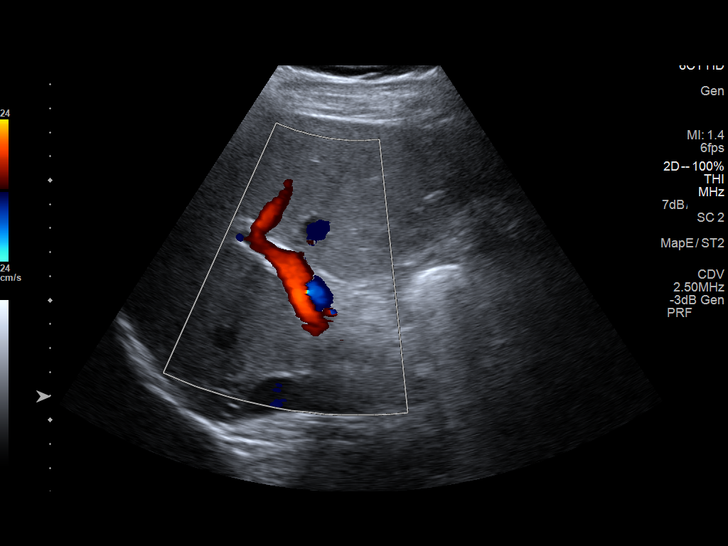
[im 54/87]
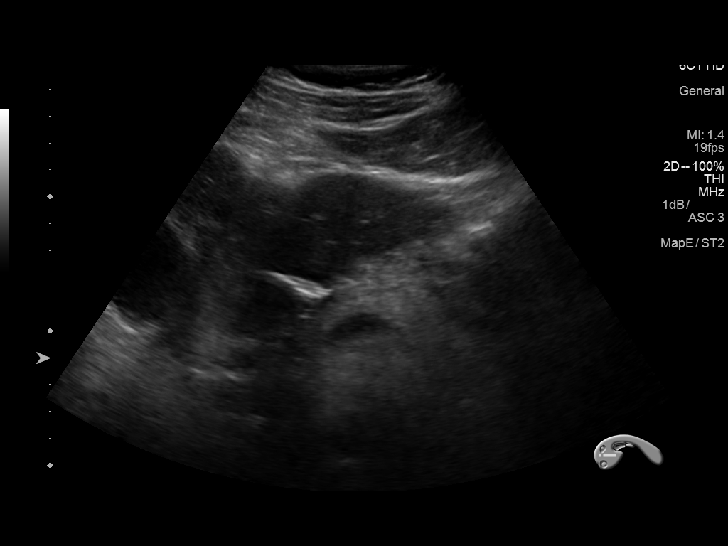
[im 58/87]
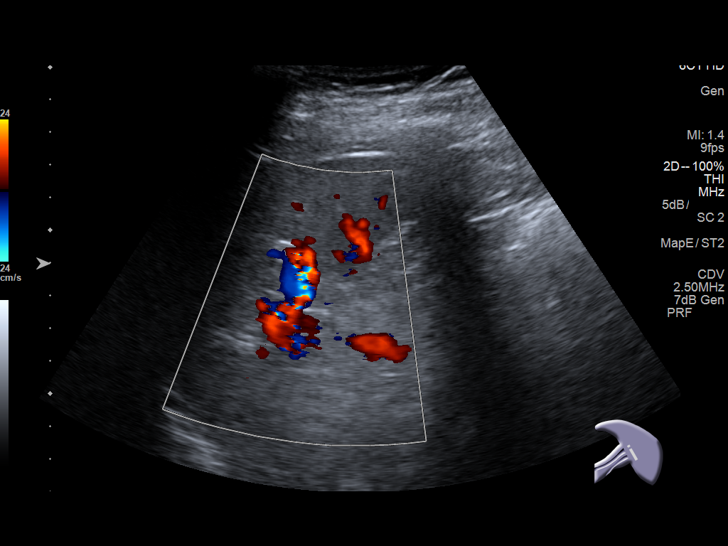
[im 65/87]
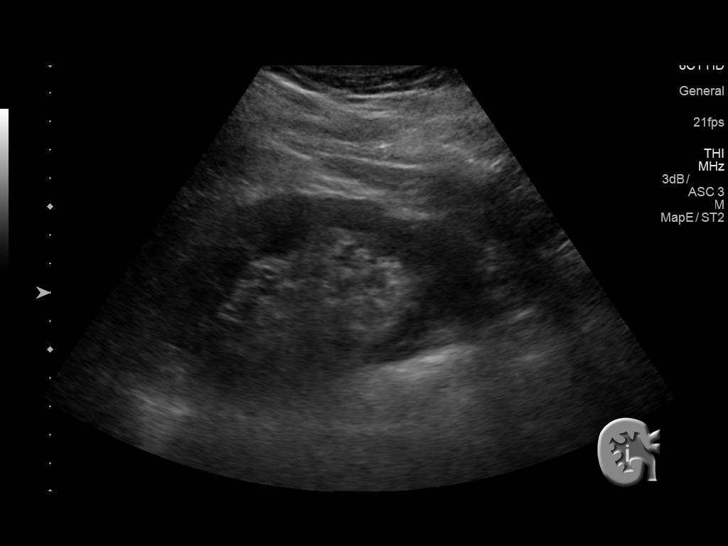
[im 72/87]
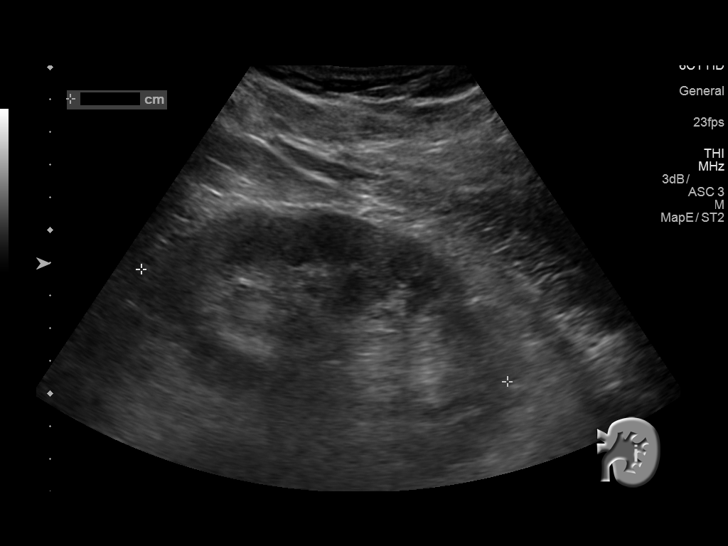
[im 79/87]
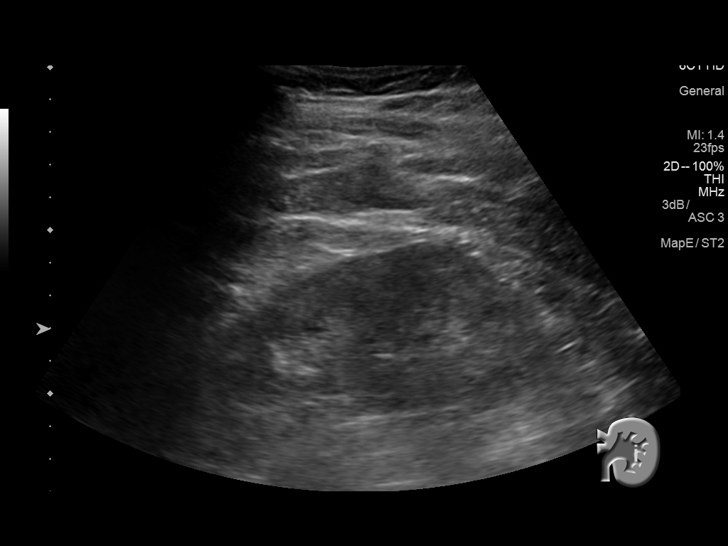
[im 87/87]
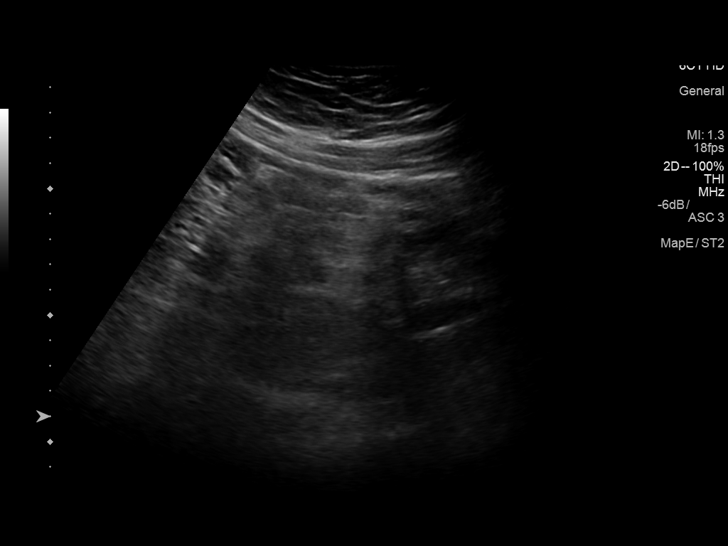

[14 of 25 positions shown; findings below may reference images not displayed]

FINDINGS: Gallbladder: A few tiny stones are demonstrated in the gallbladder
along the deep and portion. Largest measures about 5 mm diameter. No
gallbladder wall thickening. Murphy's sign is negative.

Common bile duct: Diameter: 3.7 mm, normal

Liver: No focal lesion identified. Within normal limits in
parenchymal echogenicity.

IVC: No abnormality visualized.

Pancreas: Visualized portion unremarkable.

Spleen: Size and appearance within normal limits.

Right Kidney: Length: 12 cm. Echogenicity within normal limits. No
mass or hydronephrosis visualized.

Left Kidney: Length: 11.7 cm. Echogenicity within normal limits. No
mass or hydronephrosis visualized.

Abdominal aorta: No aneurysm visualized.

Other findings: None.
IMPRESSION: Cholelithiasis. No additional findings suggesting cholecystitis. No
bile duct dilatation. Examination otherwise unremarkable.

## 2018-02-15 DIAGNOSIS — H6123 Impacted cerumen, bilateral: Secondary | ICD-10-CM | POA: Diagnosis not present

## 2019-02-14 ENCOUNTER — Ambulatory Visit: Payer: 59

## 2019-02-14 ENCOUNTER — Other Ambulatory Visit: Payer: Self-pay

## 2019-02-14 ENCOUNTER — Ambulatory Visit (INDEPENDENT_AMBULATORY_CARE_PROVIDER_SITE_OTHER): Payer: 59 | Admitting: Orthopedic Surgery

## 2019-02-14 ENCOUNTER — Encounter: Payer: Self-pay | Admitting: Orthopedic Surgery

## 2019-02-14 DIAGNOSIS — M79675 Pain in left toe(s): Secondary | ICD-10-CM | POA: Diagnosis not present

## 2019-02-14 NOTE — Progress Notes (Signed)
Office Visit Note   Patient: Kevin Monroe           Date of Birth: March 23, 1966           MRN: [id number] Visit Date: 02/14/2019 Requested by: Wenda Low, MD [address] [place],  [place] [address] PCP: Wenda Low, MD  Subjective: Chief Complaint  Patient presents with  . Left Foot - Wound Check    HPI: Fares is a patient with left second toe callus.  He stumped the toe while he was at the pool in June.  He states that the toe was not really healing and went to the primary care provider.  Antibiotics was given.  Is been doing reasonably well since then.  Now he has a callus but no real drainage.  Blood sugars have been under reasonable control.  He has had the left great toe and the left third toe partially amputated.              ROS: All systems reviewed are negative as they relate to the chief complaint within the history of present illness.  Patient denies  fevers or chills.   Assessment & Plan: Visit Diagnoses:  1. Pain in left toe(s)     Plan: Impression is previous fracture left toe middle phalanx with new impact injury distally.  No evidence of fracture or osteomyelitis at this time.  There is about a centimeter by centimeter callus on the tip of the toe.  No induration or drainage around the nail bed.  I did trim that callus a little bit and I want to leave it on as a biologic dressing but I do not think there is active infection at this time.  I did discuss with him the warning signs for infection which would be any type of drainage when that callus comes off or any redness around the distal aspect of the toe.  I will see him back as needed.  Follow-Up Instructions: No follow-ups on file.   Orders:  Orders Placed This Encounter  Procedures  . XR Toe 2nd Left   No orders of the defined types were placed in this encounter.     Procedures: No procedures performed   Clinical Data: No additional findings.  Objective: Vital Signs: There  were no vitals taken for this visit.  Physical Exam:   Constitutional: Patient appears well-developed HEENT:  Head: Normocephalic Eyes:EOM are normal Neck: Normal range of motion Cardiovascular: Normal rate Pulmonary/chest: Effort normal Neurologic: Patient is alert Skin: Skin is warm Psychiatric: Patient has normal mood and affect    Ortho Exam: Ortho exam demonstrates full active and passive range of motion of that left second toe.  Does have diminished sensation is expected with diabetic neuropathy.  The toe does have a bit of a lateral bend to it at the middle phalanx consistent with his history of known healed fracture.  There is a 1/2 x 1/2 cm callus around the distal aspect of the toe.  No induration drainage or erythema around this area.  That callus is trimmed back.  Extension is intact on the toe.  Specialty Comments:  No specialty comments available.  Imaging: No results found.   PMFS History: Patient Active Problem List   Diagnosis Date Noted  . Tinea pedis of right foot 12/21/2016   Past Medical History:  Diagnosis Date  . Depression   . Diabetes mellitus without complication ([place])   . Dysthymia     History reviewed.  No pertinent family history.  Past Surgical History:  Procedure Laterality Date  . APPENDECTOMY    . TOE AMPUTATION Left    Social History   Occupational History  . Not on file  Tobacco Use  . Smoking status: Never Smoker  . Smokeless tobacco: Never Used  Substance and Sexual Activity  . Alcohol use: No  . Drug use: Not on file  . Sexual activity: Not on file

## 2019-06-06 ENCOUNTER — Other Ambulatory Visit: Payer: Self-pay

## 2019-06-06 DIAGNOSIS — Z20822 Contact with and (suspected) exposure to covid-19: Secondary | ICD-10-CM

## 2019-06-08 LAB — NOVEL CORONAVIRUS, NAA: SARS-CoV-2, NAA: NOT DETECTED

## 2019-09-29 IMAGING — US US EXTREM LOW VENOUS*R*
1 series · 13 of 24 positions shown · non-contrast
Comparison: None.

CLINICAL DATA: 51-year-old male with a history of leg edema



[Series 1: us extrem low venous*right* · 0.11mm/px · 13 of 52 slices shown]
[im 1/52]
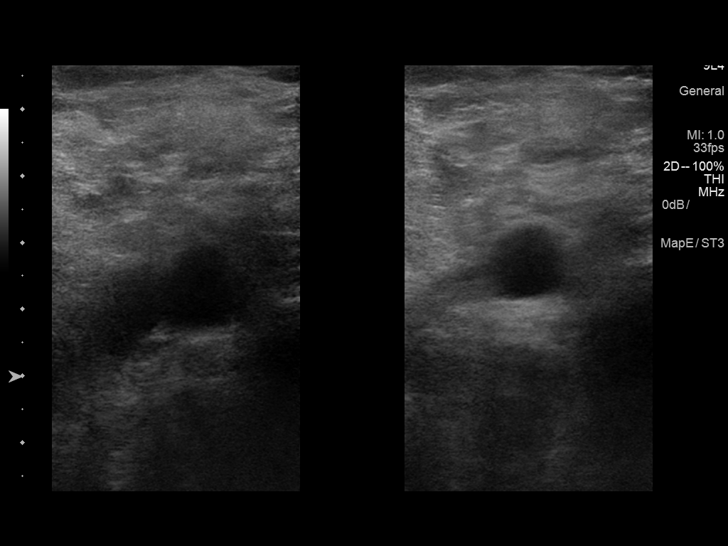
[im 5/52]
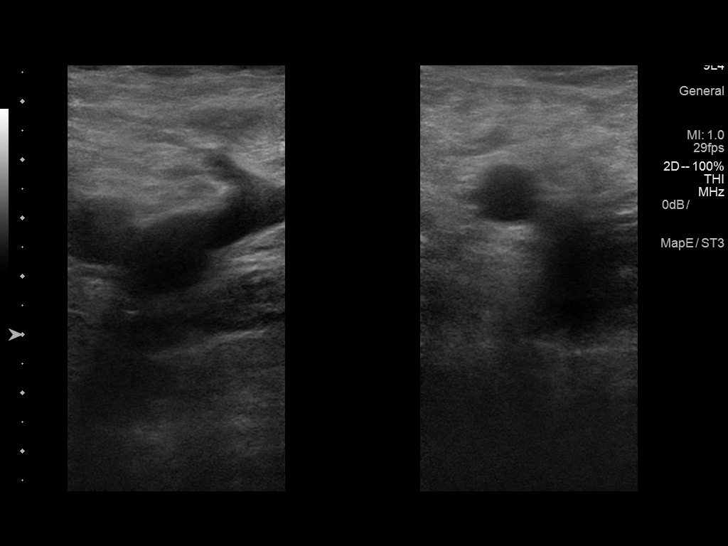
[im 9/52]
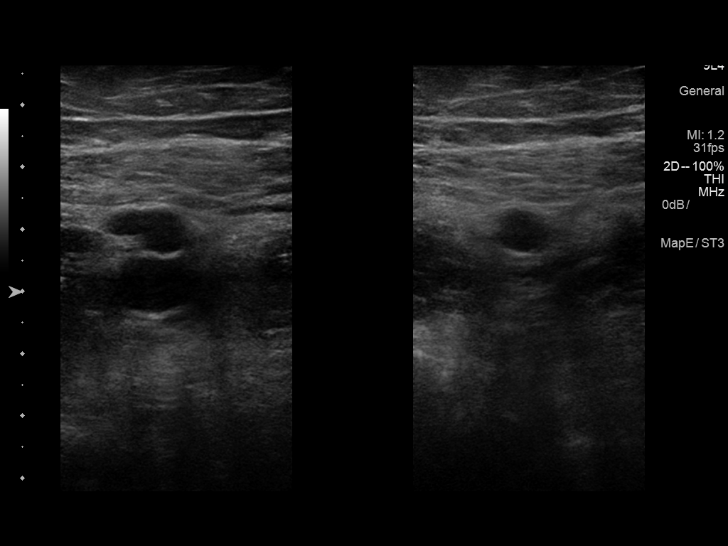
[im 14/52]
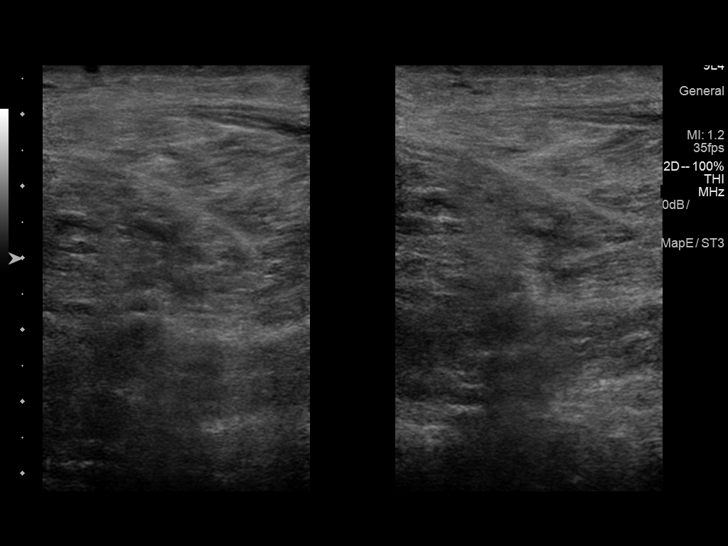
[im 18/52]
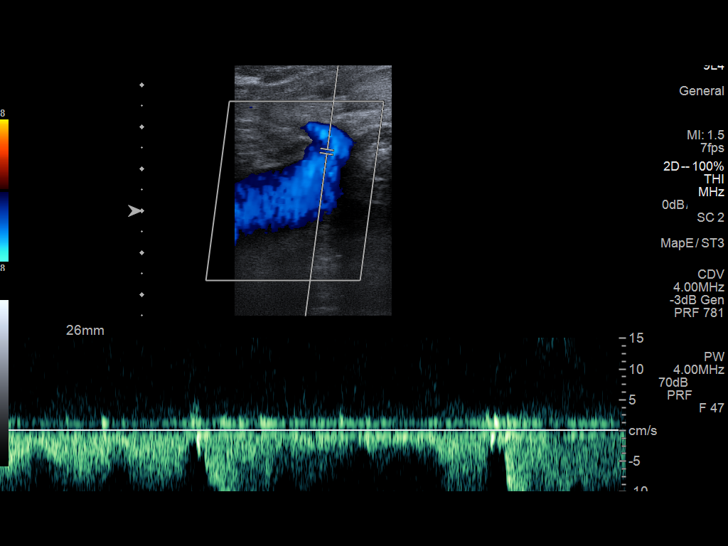
[im 23/52]
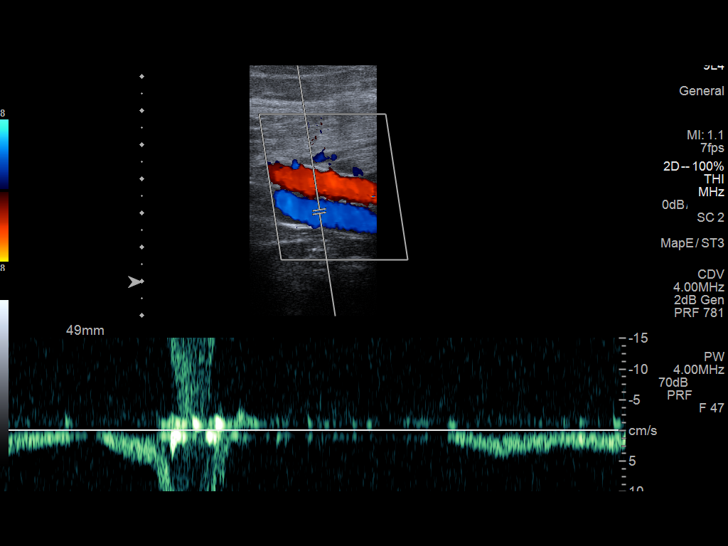
[im 27/52]
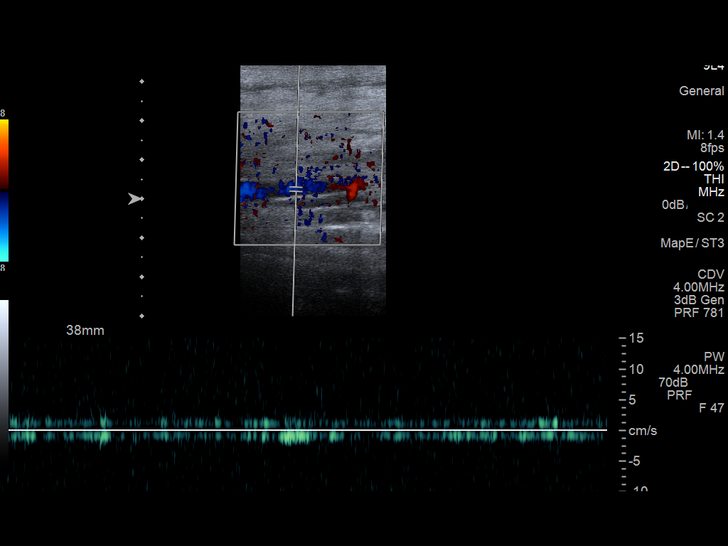
[im 29/52]
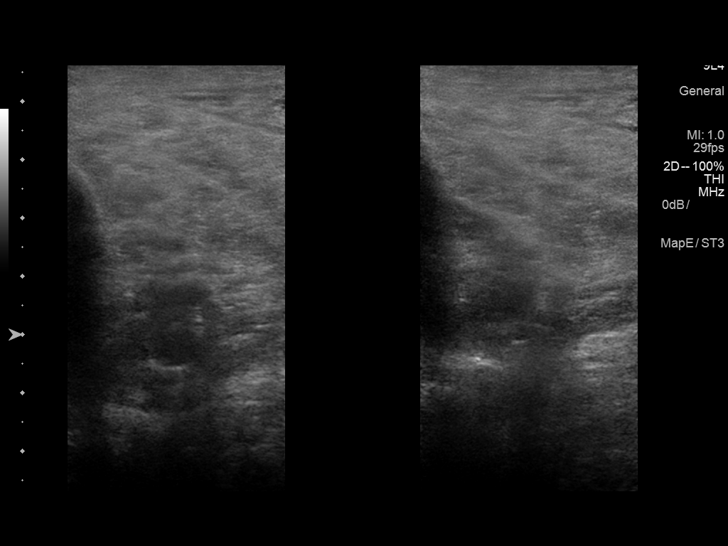
[im 34/52]
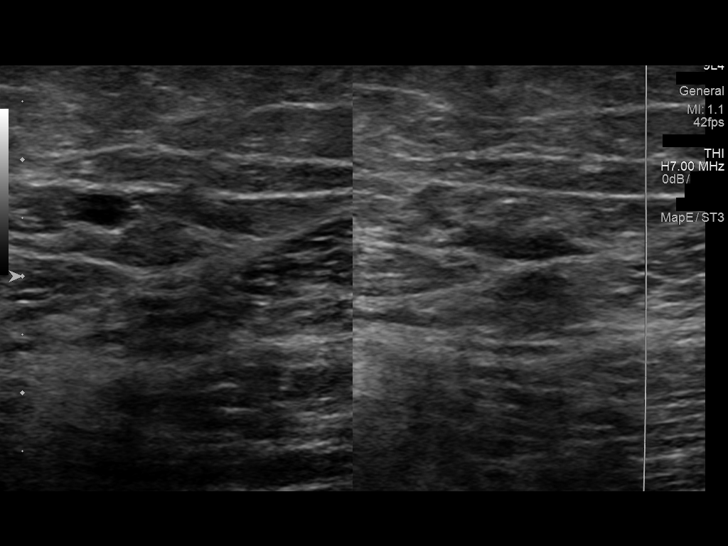
[im 38/52]
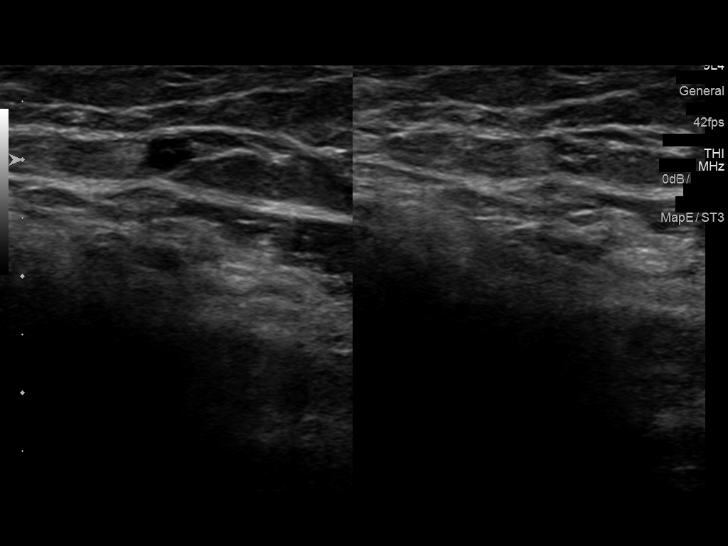
[im 43/52]
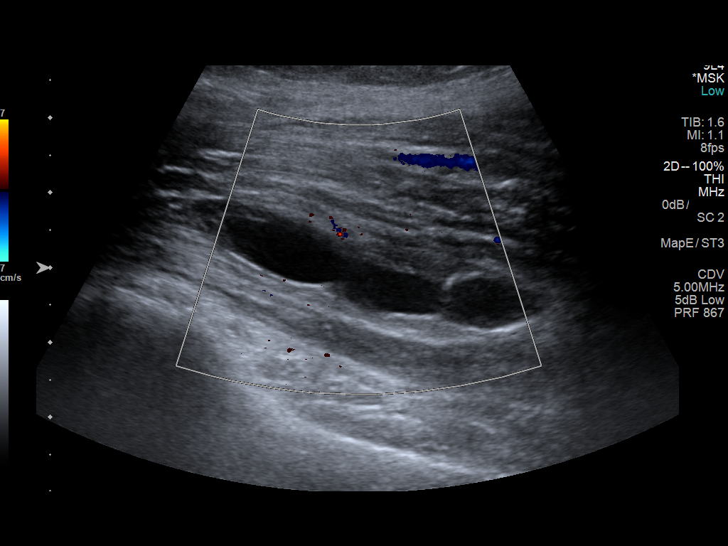
[im 47/52]
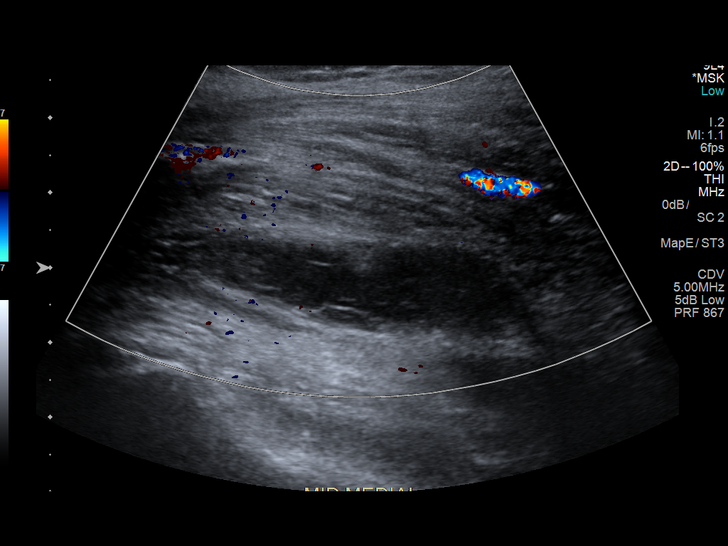
[im 52/52]
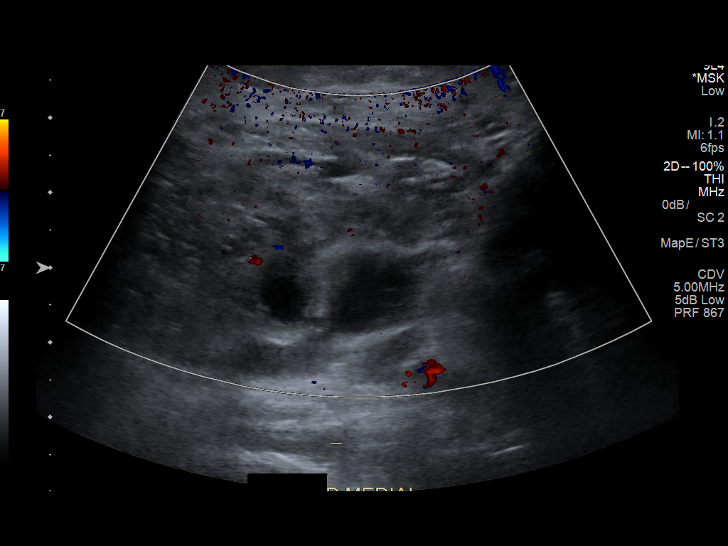

[13 of 24 positions shown; findings below may reference images not displayed]

FINDINGS: Contralateral Common Femoral Vein: Respiratory phasicity is normal
and symmetric with the symptomatic side. No evidence of thrombus.
Normal compressibility.

Common Femoral Vein: No evidence of thrombus. Normal
compressibility, respiratory phasicity and response to augmentation.

Saphenofemoral Junction: No evidence of thrombus. Normal
compressibility and flow on color Doppler imaging.

Profunda Femoral Vein: No evidence of thrombus. Normal
compressibility and flow on color Doppler imaging.

Femoral Vein: No evidence of thrombus. Normal compressibility,
respiratory phasicity and response to augmentation.

Popliteal Vein: No evidence of thrombus. Normal compressibility,
respiratory phasicity and response to augmentation.

Calf Veins: No evidence of thrombus. Normal compressibility and flow
on color Doppler imaging.

Superficial Great Saphenous Vein: No evidence of thrombus. Normal
compressibility and flow on color Doppler imaging.

Other Findings:  Edema of the right lower extremity.

Loculated fluid collection in the posteromedial mid calf with
heterogeneously echoic internal component and no internal flow.
Greatest diameter 4.3 cm with lentiform configuration. This appears
centered within the musculature of the mid calf, and is not seen to
communicate to the knee joint on this survey.
IMPRESSION: Sonographic survey of the right lower extremity negative for DVT.

Complex fluid collection in the medial posterior calf, appears
partially loculated with the largest component measuring 4.3 cm.
Given the location and appearance, involving hematoma, seroma, or
infection are considered more likely than a Baker's cyst.

Edema

## 2022-04-07 ENCOUNTER — Ambulatory Visit: Payer: 59 | Admitting: Orthopedic Surgery

## 2022-04-12 ENCOUNTER — Ambulatory Visit: Payer: 59 | Admitting: Orthopedic Surgery

## 2022-04-26 ENCOUNTER — Ambulatory Visit (INDEPENDENT_AMBULATORY_CARE_PROVIDER_SITE_OTHER): Payer: 59

## 2022-04-26 ENCOUNTER — Encounter: Payer: Self-pay | Admitting: Orthopedic Surgery

## 2022-04-26 ENCOUNTER — Ambulatory Visit (INDEPENDENT_AMBULATORY_CARE_PROVIDER_SITE_OTHER): Payer: 59 | Admitting: Orthopedic Surgery

## 2022-04-26 DIAGNOSIS — M79672 Pain in left foot: Secondary | ICD-10-CM

## 2022-04-26 NOTE — Progress Notes (Signed)
Office Visit Note   Patient: Kevin Monroe           Date of Birth: [birth year]           MRN: [id number] Visit Date: 04/26/2022 Requested by: Wenda Low, MD [address] [place] [address] [place],  [place] [address] PCP: Wenda Low, MD  Subjective: Chief Complaint  Patient presents with   Left Foot - Open Wound    HPI: Kevin Monroe is a 56 y.o. male who presents to the office reporting left foot infection.  Noticed a blood blister which has developed into a wound over the past several weeks.  Has been ongoing for several months.  Has not taken any oral antibiotics.  He denies any fevers or chills.  He has been using triple antibiotic ointment daily.  Reports some occasional drainage from this region.  Most recent A1c 7.2.  Has a history of left great toe amputation in 2006..                ROS: All systems reviewed are negative as they relate to the chief complaint within the history of present illness.  Patient denies fevers or chills.  Assessment & Plan: Visit Diagnoses:  1. Pain in left foot     Plan: Impression is left foot diabetic wound on the plantar aspect of the remnant great toe.  This area is debrided today.  Calluses removed.  No draining sinuses are present.  Picture of the wound is included.  We also had Dr. Sharol Given evaluate the patient today who recommended down at unloading of this area and is possible he may require revision amputation.  He will follow-up with Dr. Sharol Given in 4 weeks.  Follow-Up Instructions: No follow-ups on file.   Orders:  Orders Placed This Encounter  Procedures   XR Foot Complete Left   No orders of the defined types were placed in this encounter.     Procedures: No procedures performed   Clinical Data: No additional findings.  Objective: Vital Signs: There were no vitals taken for this visit.  Physical Exam:  Constitutional: Patient appears well-developed HEENT:  Head: Normocephalic Eyes:EOM are normal Neck: Normal  range of motion Cardiovascular: Normal rate Pulmonary/chest: Effort normal Neurologic: Patient is alert Skin: Skin is warm Psychiatric: Patient has normal mood and affect  Ortho Exam: Ortho exam demonstrates palpable pedal pulses with diminished sensation on the dorsal and plantar aspect of the foot consistent with diabetic neuropathy.  Patient has palpable intact nontender anterior to posterior to peroneal and Achilles tendons.  Heel cord not excessively tight to my exam with about 10 to 15 degrees of dorsiflexion past neutral with the knee flexed.  Specialty Comments:  No specialty comments available.  Imaging: No results found.   PMFS History: Patient Active Problem List   Diagnosis Date Noted   Tinea pedis of right foot 12/21/2016   Past Medical History:  Diagnosis Date   Depression    Diabetes mellitus without complication ([place])    Dysthymia     No family history on file.  Past Surgical History:  Procedure Laterality Date   APPENDECTOMY     TOE AMPUTATION Left    Social History   Occupational History   Not on file  Tobacco Use   Smoking status: Never   Smokeless tobacco: Never  Substance and Sexual Activity   Alcohol use: No   Drug use: Not on file   Sexual activity: Not on file

## 2022-05-24 ENCOUNTER — Ambulatory Visit (INDEPENDENT_AMBULATORY_CARE_PROVIDER_SITE_OTHER): Payer: 59 | Admitting: Orthopedic Surgery

## 2022-05-24 ENCOUNTER — Encounter: Payer: Self-pay | Admitting: Orthopedic Surgery

## 2022-05-24 DIAGNOSIS — L97521 Non-pressure chronic ulcer of other part of left foot limited to breakdown of skin: Secondary | ICD-10-CM | POA: Diagnosis not present

## 2022-05-24 DIAGNOSIS — M6702 Short Achilles tendon (acquired), left ankle: Secondary | ICD-10-CM

## 2022-05-24 NOTE — Progress Notes (Signed)
Office Visit Note   Patient: Kevin Monroe           Date of Birth: 18-Sep-1965           MRN: [id number] Visit Date: 05/24/2022              Requested by: Wenda Low, MD [address] [place] [address] [place],  [place] [address] PCP: Wenda Low, MD  Chief Complaint  Patient presents with   Left Foot - Follow-up      HPI: Patient is a 56 year old gentleman who is seen in follow-up for plantar ulcer MTP joint left great toe status post great toe amputation in 2006.  Assessment & Plan: Visit Diagnoses:  1. Non-pressure chronic ulcer of other part of left foot limited to breakdown of skin ([place])   2. Achilles tendon contracture, left     Plan: Patient was given instructions and demonstrated Achilles stretching the ulcer was debrided back to healthy viable tissue.  He has good orthotics good stiff shoe wear.  Follow-Up Instructions: Return in about 4 weeks (around 06/21/2022).   Ortho Exam  Patient is alert, oriented, no adenopathy, well-dressed, normal affect, normal respiratory effort. Examination patient has a strong dorsalis pedis pulse he has hypertrophic callus and ulceration beneath the first metatarsal head of the great toe left foot.  Status post partial great toe amputation.  There is no ascending cellulitis.  After informed consent a 10 blade knife was used to debride the skin and soft tissue back to healthy viable granulation tissue.  After debridement the ulcer was 2 cm in diameter 2 mm deep there is no exposed bone or tendon.  With the knee extended he has dorsiflexion of only 10 degrees of the ankle.  Silver nitrate was used for hemostasis and a Band-Aid was applied.  Imaging: No results found. No images are attached to the encounter.  Labs: No results found for: "HGBA1C", "ESRSEDRATE", "CRP", "LABURIC", "REPTSTATUS", "GRAMSTAIN", "CULT", "LABORGA"   Lab Results  Component Value Date   ALBUMIN 4.8 08/27/2015    No results found for: "MG" No results  found for: "VD25OH"  No results found for: "PREALBUMIN"    Latest Ref Rng & Units 08/27/2015   10:20 PM 10/14/2014    5:09 PM  CBC EXTENDED  WBC 4.0 - 10.5 K/uL 14.8    RBC 4.22 - 5.81 MIL/uL 5.04    Hemoglobin 13.0 - 17.0 g/dL 16.2  15.3   HCT 39.0 - 52.0 % 46.2  45.0   Platelets 150 - 400 K/uL 189       There is no height or weight on file to calculate BMI.  Orders:  No orders of the defined types were placed in this encounter.  No orders of the defined types were placed in this encounter.    Procedures: No procedures performed  Clinical Data: No additional findings.  ROS:  All other systems negative, except as noted in the HPI. Review of Systems  Objective: Vital Signs: There were no vitals taken for this visit.  Specialty Comments:  No specialty comments available.  PMFS History: Patient Active Problem List   Diagnosis Date Noted   Tinea pedis of right foot 12/21/2016   Past Medical History:  Diagnosis Date   Depression    Diabetes mellitus without complication ([place])    Dysthymia     History reviewed. No pertinent family history.  Past Surgical History:  Procedure Laterality Date   APPENDECTOMY     TOE AMPUTATION Left  Social History   Occupational History   Not on file  Tobacco Use   Smoking status: Never   Smokeless tobacco: Never  Substance and Sexual Activity   Alcohol use: No   Drug use: Not on file   Sexual activity: Not on file

## 2022-06-21 ENCOUNTER — Ambulatory Visit (INDEPENDENT_AMBULATORY_CARE_PROVIDER_SITE_OTHER): Payer: 59 | Admitting: Orthopedic Surgery

## 2022-06-21 ENCOUNTER — Encounter: Payer: Self-pay | Admitting: Orthopedic Surgery

## 2022-06-21 DIAGNOSIS — L97521 Non-pressure chronic ulcer of other part of left foot limited to breakdown of skin: Secondary | ICD-10-CM

## 2022-06-21 DIAGNOSIS — M6702 Short Achilles tendon (acquired), left ankle: Secondary | ICD-10-CM

## 2022-06-21 NOTE — Progress Notes (Signed)
Office Visit Note   Patient: Kevin Monroe           Date of Birth: [birth year]           MRN: [id number] Visit Date: 06/21/2022              Requested by: Wenda Low, MD [address] [place] [address] [place],  [name] [address] PCP: Wenda Low, MD  Chief Complaint  Patient presents with   Left Foot - Follow-up      HPI: Patient is a 56 year old gentleman who presents in follow-up for an ulcer plantar aspect of the left foot.  Patient is status post partial amputation of the left great toe.  Assessment & Plan: Visit Diagnoses:  1. Non-pressure chronic ulcer of other part of left foot limited to breakdown of skin ([place])   2. Achilles tendon contracture, left     Plan: Patient was given to donuts to further unload pressure from the great toe ulcer.  Discussed that if this does not resolve with conservative therapy and amputation of the residual proximal phalanx is an option.  Follow-Up Instructions: Return in about 4 weeks (around 07/19/2022).   Ortho Exam  Patient is alert, oriented, no adenopathy, well-dressed, normal affect, normal respiratory effort. Examination patient has ankle dorsiflexion of about 20 degrees.  He has a small residual bone of the proximal phalanx.  There is ulcer beneath the proximal phalanx.  After informed consent a 10 blade knife was used to debride the skin and soft tissue back to healthy viable tissue.  After debridement the ulcer is 15 mm in diameter 1 mm deep without exposed bone or tendon.  Imaging: No results found. No images are attached to the encounter.  Labs: No results found for: "HGBA1C", "ESRSEDRATE", "CRP", "LABURIC", "REPTSTATUS", "GRAMSTAIN", "CULT", "LABORGA"   Lab Results  Component Value Date   ALBUMIN 4.8 08/27/2015    No results found for: "MG" No results found for: "VD25OH"  No results found for: "PREALBUMIN"    Latest Ref Rng & Units 08/27/2015   10:20 PM 10/14/2014    5:09 PM  CBC EXTENDED  WBC 4.0 - 10.5  K/uL 14.8    RBC 4.22 - 5.81 MIL/uL 5.04    Hemoglobin 13.0 - 17.0 g/dL 16.2  15.3   HCT 39.0 - 52.0 % 46.2  45.0   Platelets 150 - 400 K/uL 189       There is no height or weight on file to calculate BMI.  Orders:  No orders of the defined types were placed in this encounter.  No orders of the defined types were placed in this encounter.    Procedures: No procedures performed  Clinical Data: No additional findings.  ROS:  All other systems negative, except as noted in the HPI. Review of Systems  Objective: Vital Signs: There were no vitals taken for this visit.  Specialty Comments:  No specialty comments available.  PMFS History: Patient Active Problem List   Diagnosis Date Noted   Tinea pedis of right foot 12/21/2016   Past Medical History:  Diagnosis Date   Depression    Diabetes mellitus without complication ([place])    Dysthymia     History reviewed. No pertinent family history.  Past Surgical History:  Procedure Laterality Date   APPENDECTOMY     TOE AMPUTATION Left    Social History   Occupational History   Not on file  Tobacco Use   Smoking status: Never   Smokeless tobacco:  Never  Substance and Sexual Activity   Alcohol use: No   Drug use: Not on file   Sexual activity: Not on file

## 2022-07-19 ENCOUNTER — Ambulatory Visit (INDEPENDENT_AMBULATORY_CARE_PROVIDER_SITE_OTHER): Payer: 59 | Admitting: Orthopedic Surgery

## 2022-07-19 DIAGNOSIS — M6702 Short Achilles tendon (acquired), left ankle: Secondary | ICD-10-CM

## 2022-07-19 DIAGNOSIS — L97521 Non-pressure chronic ulcer of other part of left foot limited to breakdown of skin: Secondary | ICD-10-CM

## 2022-07-23 ENCOUNTER — Encounter: Payer: Self-pay | Admitting: Orthopedic Surgery

## 2022-07-23 NOTE — Progress Notes (Signed)
Office Visit Note   Patient: Kevin Monroe           Date of Birth: [birth year]           MRN: [id number] Visit Date: 07/19/2022              Requested by: Wenda Low, MD [address] [place] [address] [place],  [place] [address] PCP: Wenda Low, MD  Chief Complaint  Patient presents with   Left Foot - Wound Check      HPI: Patient is a 57 year old gentleman with a left great toe ulcer.  Patient has been using a felt relieving donut.  Assessment & Plan: Visit Diagnoses:  1. Non-pressure chronic ulcer of other part of left foot limited to breakdown of skin ([place])   2. Achilles tendon contracture, left     Plan: Continue with the felt relieving donut follow-up in 4 weeks discussed the possibility of resectioning the residual great toe proximal phalanx to resolve the ulceration.  Follow-Up Instructions: Return in about 4 weeks (around 08/16/2022).   Ortho Exam  Patient is alert, oriented, no adenopathy, well-dressed, normal affect, normal respiratory effort. Examination patient has a strong dorsalis pedis pulse.  He has improved dorsiflexion of the ankle.  He has a Wagner grade 1 ulcer left great toe.  After informed consent a 10 blade knife was used debride the skin and soft tissue back to healthy granulation tissue the ulcer was 2 cm in diameter after debridement.  Imaging: No results found. No images are attached to the encounter.  Labs: No results found for: "HGBA1C", "ESRSEDRATE", "CRP", "LABURIC", "REPTSTATUS", "GRAMSTAIN", "CULT", "LABORGA"   Lab Results  Component Value Date   ALBUMIN 4.8 08/27/2015    No results found for: "MG" No results found for: "VD25OH"  No results found for: "PREALBUMIN"    Latest Ref Rng & Units 08/27/2015   10:20 PM 10/14/2014    5:09 PM  CBC EXTENDED  WBC 4.0 - 10.5 K/uL 14.8    RBC 4.22 - 5.81 MIL/uL 5.04    Hemoglobin 13.0 - 17.0 g/dL 16.2  15.3   HCT 39.0 - 52.0 % 46.2  45.0   Platelets 150 - 400 K/uL 189        There is no height or weight on file to calculate BMI.  Orders:  No orders of the defined types were placed in this encounter.  No orders of the defined types were placed in this encounter.    Procedures: No procedures performed  Clinical Data: No additional findings.  ROS:  All other systems negative, except as noted in the HPI. Review of Systems  Objective: Vital Signs: There were no vitals taken for this visit.  Specialty Comments:  No specialty comments available.  PMFS History: Patient Active Problem List   Diagnosis Date Noted   Tinea pedis of right foot 12/21/2016   Past Medical History:  Diagnosis Date   Depression    Diabetes mellitus without complication ([place])    Dysthymia     History reviewed. No pertinent family history.  Past Surgical History:  Procedure Laterality Date   APPENDECTOMY     TOE AMPUTATION Left    Social History   Occupational History   Not on file  Tobacco Use   Smoking status: Never   Smokeless tobacco: Never  Substance and Sexual Activity   Alcohol use: No   Drug use: Not on file   Sexual activity: Not on file

## 2022-08-04 ENCOUNTER — Telehealth: Payer: Self-pay | Admitting: Orthopedic Surgery

## 2022-08-04 NOTE — Telephone Encounter (Signed)
Called patient left message to return call to schedule an appointment with Dr. Sharol Given or Junie Panning for a wound on left foot

## 2022-08-30 ENCOUNTER — Ambulatory Visit: Payer: 59 | Admitting: Orthopedic Surgery

## 2022-08-30 ENCOUNTER — Ambulatory Visit (INDEPENDENT_AMBULATORY_CARE_PROVIDER_SITE_OTHER): Payer: 59 | Admitting: Orthopedic Surgery

## 2022-08-30 DIAGNOSIS — L97521 Non-pressure chronic ulcer of other part of left foot limited to breakdown of skin: Secondary | ICD-10-CM | POA: Diagnosis not present

## 2022-08-30 MED ORDER — DOXYCYCLINE HYCLATE 100 MG PO TABS: 100.0000 mg | ORAL_TABLET | Freq: Two times a day (BID) | ORAL | 0 refills | Status: DC

## 2022-09-08 ENCOUNTER — Encounter: Payer: Self-pay | Admitting: Orthopedic Surgery

## 2022-09-08 NOTE — Progress Notes (Signed)
Office Visit Note   Patient: Kevin Monroe           Date of Birth: [birth year]           MRN: [id number] Visit Date: 08/30/2022              Requested by: Wenda Low, MD [place] [address] [place] [address] [place],  [place] [address] PCP: Wenda Low, MD  Chief Complaint  Patient presents with   Left Foot - Wound Check    Great toe ulcer      HPI: Patient is a 57 year old gentleman who presents follow-up left great toe ulcer.  Patient has been using a felt relieving donut.  We have discussed the possibility of resecting the residual great toe proximal phalanx.  Assessment & Plan: Visit Diagnoses:  1. Non-pressure chronic ulcer of other part of left foot limited to breakdown of skin ([place])     Plan: Patient is provided a prescription for doxycycline he is traveling to [place] and may require antibiotics while on vacation.  Recommend he continue with the donut and the orthotic.  He was provided a sheet of silver cell.  Will follow-up after his trip to [place] and scheduled for surgery most likely gastrocnemius recession bilaterally and a left great toe amputation of the residual base of the great toe.  Follow-Up Instructions: Return in about 4 weeks (around 09/27/2022).   Ortho Exam  Patient is alert, oriented, no adenopathy, well-dressed, normal affect, normal respiratory effort. Examination patient has good pulses bilaterally.  Patient has tried Achilles stretching without improvement he has dorsiflexion to 0 degrees bilaterally.  Patient is starting to develop a new ulcer on the right great toe 2 mm in diameter.  The left great toe ulcer is stable.  There is no cellulitis.  Imaging: No results found. No images are attached to the encounter.  Labs: No results found for: "HGBA1C", "ESRSEDRATE", "CRP", "LABURIC", "REPTSTATUS", "GRAMSTAIN", "CULT", "LABORGA"   Lab Results  Component Value Date   ALBUMIN 4.8 08/27/2015    No results found for: "MG" No results found  for: "VD25OH"  No results found for: "PREALBUMIN"    Latest Ref Rng & Units 08/27/2015   10:20 PM 10/14/2014    5:09 PM  CBC EXTENDED  WBC 4.0 - 10.5 K/uL 14.8    RBC 4.22 - 5.81 MIL/uL 5.04    Hemoglobin 13.0 - 17.0 g/dL 16.2  15.3   HCT 39.0 - 52.0 % 46.2  45.0   Platelets 150 - 400 K/uL 189       There is no height or weight on file to calculate BMI.  Orders:  No orders of the defined types were placed in this encounter.  Meds ordered this encounter  Medications   doxycycline (VIBRA-TABS) 100 MG tablet    Sig: Take 1 tablet (100 mg total) by mouth 2 (two) times daily.    Dispense:  20 tablet    Refill:  0     Procedures: No procedures performed  Clinical Data: No additional findings.  ROS:  All other systems negative, except as noted in the HPI. Review of Systems  Objective: Vital Signs: There were no vitals taken for this visit.  Specialty Comments:  No specialty comments available.  PMFS History: Patient Active Problem List   Diagnosis Date Noted   Tinea pedis of right foot 12/21/2016   Past Medical History:  Diagnosis Date   Depression    Diabetes mellitus without complication ([place])  Dysthymia     History reviewed. No pertinent family history.  Past Surgical History:  Procedure Laterality Date   APPENDECTOMY     TOE AMPUTATION Left    Social History   Occupational History   Not on file  Tobacco Use   Smoking status: Never   Smokeless tobacco: Never  Substance and Sexual Activity   Alcohol use: No   Drug use: Not on file   Sexual activity: Not on file

## 2022-10-25 ENCOUNTER — Ambulatory Visit: Payer: 59 | Admitting: Orthopedic Surgery

## 2022-10-28 ENCOUNTER — Ambulatory Visit (INDEPENDENT_AMBULATORY_CARE_PROVIDER_SITE_OTHER): Payer: 59 | Admitting: Orthopedic Surgery

## 2022-10-28 DIAGNOSIS — M86172 Other acute osteomyelitis, left ankle and foot: Secondary | ICD-10-CM

## 2022-10-28 DIAGNOSIS — M6702 Short Achilles tendon (acquired), left ankle: Secondary | ICD-10-CM

## 2022-11-01 ENCOUNTER — Telehealth: Payer: Self-pay | Admitting: Orthopedic Surgery

## 2022-11-01 ENCOUNTER — Encounter: Payer: Self-pay | Admitting: Orthopedic Surgery

## 2022-11-01 ENCOUNTER — Other Ambulatory Visit: Payer: Self-pay | Admitting: Surgical

## 2022-11-01 ENCOUNTER — Telehealth: Payer: Self-pay

## 2022-11-01 ENCOUNTER — Other Ambulatory Visit: Payer: Self-pay | Admitting: Orthopedic Surgery

## 2022-11-01 MED ORDER — DOXYCYCLINE HYCLATE 100 MG PO TABS: 100.0000 mg | ORAL_TABLET | Freq: Two times a day (BID) | ORAL | 0 refills | Status: DC

## 2022-11-01 NOTE — Telephone Encounter (Signed)
I spoke with Kevin Monroe. All surgery information given.  He is scheduled at [facility] for this Wednesday 11/03/22.

## 2022-11-01 NOTE — Telephone Encounter (Signed)
Tried calling pt, couldn't get a hold of him when I called.  Will try again. Surgery being scheduled for 11/03/22, Elnita Maxwell to call with information.

## 2022-11-01 NOTE — Telephone Encounter (Signed)
Pt informed. Duplicate message sent to [facility] in reference to surgery and an alternate number to reach him at.

## 2022-11-01 NOTE — Telephone Encounter (Signed)
Patient called triage. He is suppose to have surgery on Friday with Dr.Duda for his great toe, but has not heard anything from anyone. He said that his infection is getting worse and he does not feel well. No fever, nausea, etc...just doesn't feel good. He wants an antibiotic sent to [facility] on [address]. [phone]

## 2022-11-01 NOTE — Telephone Encounter (Signed)
Pt informed. Elnita Maxwell, here is the number to contact pt about his surgery/ His cell phone died.

## 2022-11-01 NOTE — Telephone Encounter (Signed)
Patients other phone died so please return the call at this number [phone].

## 2022-11-01 NOTE — Telephone Encounter (Signed)
Called number below to advise of abx sent earlier by Dr. Lajoyce Corners. (See previous telephone message) I couldn't get connected with pt at this time.

## 2022-11-01 NOTE — Progress Notes (Signed)
Office Visit Note   Patient: Kevin Monroe           Date of Birth: Nov 09, 1965           MRN: [id number] Visit Date: 10/28/2022              Requested by: Georgann Housekeeper, MD [address] [place],  [place] [address] PCP: Georgann Housekeeper, MD  Chief Complaint  Patient presents with   Left Foot - Wound Check    Great toe ulcer      HPI: Patient is a 58 year old gentleman who is seen in follow-up for ulceration left great toe with underlying osteomyelitis.  Patient is on doxycycline he has been using a felt relieving donut.  Patient has recently traveled to [place].  Assessment & Plan: Visit Diagnoses:  1. Achilles tendon contracture, left   2. Acute osteomyelitis of toe, left     Plan: Discussed with patient with the increased swelling persistent ulceration and failure of antibiotics recommended proceeding with a left great toe amputation through the MTP joint and a gastrocnemius recession.  Patient states he would like to wait until next week.  Follow-Up Instructions: No follow-ups on file.   Ortho Exam  Patient is alert, oriented, no adenopathy, well-dressed, normal affect, normal respiratory effort. Examination patient has a strong dorsalis pedis pulse there is increased swelling and increased size of the ulcer left great toe.  With his knee extended patient lacks dorsiflexion to neutral with a contracture of about 20 degrees.  Imaging: No results found. No images are attached to the encounter.  Labs: No results found for: "HGBA1C", "ESRSEDRATE", "CRP", "LABURIC", "REPTSTATUS", "GRAMSTAIN", "CULT", "LABORGA"   Lab Results  Component Value Date   ALBUMIN 4.8 08/27/2015    No results found for: "MG" No results found for: "VD25OH"  No results found for: "PREALBUMIN"    Latest Ref Rng & Units 08/27/2015   10:20 PM 10/14/2014    5:09 PM  CBC EXTENDED  WBC 4.0 - 10.5 K/uL 14.8    RBC 4.22 - 5.81 MIL/uL 5.04    Hemoglobin 13.0 - 17.0 g/dL 19.1  47.8    HCT 29.5 - 52.0 % 46.2  45.0   Platelets 150 - 400 K/uL 189       There is no height or weight on file to calculate BMI.  Orders:  No orders of the defined types were placed in this encounter.  No orders of the defined types were placed in this encounter.    Procedures: No procedures performed  Clinical Data: No additional findings.  ROS:  All other systems negative, except as noted in the HPI. Review of Systems  Objective: Vital Signs: There were no vitals taken for this visit.  Specialty Comments:  No specialty comments available.  PMFS History: Patient Active Problem List   Diagnosis Date Noted   Tinea pedis of right foot 12/21/2016   Past Medical History:  Diagnosis Date   Depression    Diabetes mellitus without complication    Dysthymia     History reviewed. No pertinent family history.  Past Surgical History:  Procedure Laterality Date   APPENDECTOMY     TOE AMPUTATION Left    Social History   Occupational History   Not on file  Tobacco Use   Smoking status: Never   Smokeless tobacco: Never  Substance and Sexual Activity   Alcohol use: No   Drug use: Not on file   Sexual activity: Not on  file

## 2022-11-02 ENCOUNTER — Other Ambulatory Visit: Payer: Self-pay

## 2022-11-02 ENCOUNTER — Encounter (HOSPITAL_COMMUNITY): Payer: Self-pay | Admitting: Orthopedic Surgery

## 2022-11-02 NOTE — Progress Notes (Signed)
Mr. Kevin Monroe denies chest pain or shortness of breath. Patient denies having any s/s of Covid in his household, also denies any known exposure to Covid. Mr. Kevin Monroe denies any s/s of upper or lower respiratory in the past 8 weeks.   PCP is Dr. Donette Larry, I requested records.  Mr. Kevin Monroe has Type I diabetes. Patient reports that he checks 3-5 times a day. Mr. Kevin Monroe states it is 146 now, last A1C at PCP was 7.3. I instructed Mr Kevin Monroe to take 6 units of Novolin N at hs, in am if CBG is greater that 70 take 5 units of Novolin N.  If CBG is greater than 220, take 1/2 dose of Novolog R...  I instructed patient to check CBG after awaking and every 2 hours until arrival  to the hospital.  I Instructed patient if CBG is less than 70 to take 4 Glucose Tablets or 1 tube of Glucose Gel or 1/2 cup of a clear juice. Recheck CBG in 15 minutes if CBG is not over 70 call, pre- op desk at [phone] for further instructions. If scheduled to receive Insulin, do not take Insulin

## 2022-11-03 ENCOUNTER — Other Ambulatory Visit: Payer: Self-pay

## 2022-11-03 ENCOUNTER — Ambulatory Visit (HOSPITAL_BASED_OUTPATIENT_CLINIC_OR_DEPARTMENT_OTHER): Payer: 59 | Admitting: Anesthesiology

## 2022-11-03 ENCOUNTER — Encounter (HOSPITAL_COMMUNITY): Payer: Self-pay | Admitting: Orthopedic Surgery

## 2022-11-03 ENCOUNTER — Ambulatory Visit (HOSPITAL_COMMUNITY)
Admission: RE | Admit: 2022-11-03 | Discharge: 2022-11-03 | Disposition: A | Discharge status: Home / Self Care | Payer: 59 | Attending: Orthopedic Surgery | Admitting: Orthopedic Surgery

## 2022-11-03 ENCOUNTER — Encounter (HOSPITAL_COMMUNITY)
Admission: RE | Disposition: A | Discharge status: Home / Self Care | Payer: Self-pay | Source: Home / Self Care | Attending: Orthopedic Surgery

## 2022-11-03 ENCOUNTER — Ambulatory Visit (HOSPITAL_COMMUNITY): Payer: 59 | Admitting: Anesthesiology

## 2022-11-03 DIAGNOSIS — M6702 Short Achilles tendon (acquired), left ankle: Secondary | ICD-10-CM

## 2022-11-03 DIAGNOSIS — M869 Osteomyelitis, unspecified: Secondary | ICD-10-CM

## 2022-11-03 DIAGNOSIS — E1169 Type 2 diabetes mellitus with other specified complication: Secondary | ICD-10-CM

## 2022-11-03 DIAGNOSIS — M86172 Other acute osteomyelitis, left ankle and foot: Secondary | ICD-10-CM | POA: Insufficient documentation

## 2022-11-03 HISTORY — DX: Hyperlipidemia, unspecified: E78.5

## 2022-11-03 HISTORY — PX: AMPUTATION: SHX166

## 2022-11-03 HISTORY — DX: Polyneuropathy, unspecified: G62.9

## 2022-11-03 HISTORY — PX: GASTROCNEMIUS RECESSION: SHX863

## 2022-11-03 LAB — BASIC METABOLIC PANEL
Anion gap: 11 (ref 5–15)
BUN: 10 mg/dL (ref 6–20)
CO2: 24 mmol/L (ref 22–32)
Calcium: 8.3 mg/dL — ABNORMAL LOW (ref 8.9–10.3)
Chloride: 99 mmol/L (ref 98–111)
Creatinine, Ser: 1.14 mg/dL (ref 0.61–1.24)
GFR, Estimated: >60 mL/min (ref >60)
Glucose, Bld: 211 mg/dL — ABNORMAL HIGH (ref 70–99)
Potassium: 4.5 mmol/L (ref 3.5–5.1)
Sodium: 134 mmol/L — ABNORMAL LOW (ref 135–145)

## 2022-11-03 LAB — CBC
HCT: 37.7 % — ABNORMAL LOW (ref 39.0–52.0)
Hemoglobin: 12.7 g/dL — ABNORMAL LOW (ref 13.0–17.0)
MCH: 31.8 pg (ref 26.0–34.0)
MCHC: 33.7 g/dL (ref 30.0–36.0)
MCV: 94.3 fL (ref 80.0–100.0)
Platelets: 215 10*3/uL (ref 150–400)
RBC: 4 MIL/uL — ABNORMAL LOW (ref 4.22–5.81)
RDW: 12.1 % (ref 11.5–15.5)
WBC: 12 10*3/uL — ABNORMAL HIGH (ref 4.0–10.5)
nRBC: 0 % (ref 0.0–0.2)

## 2022-11-03 LAB — GLUCOSE, CAPILLARY
Glucose-Capillary: 232 mg/dL — ABNORMAL HIGH (ref 70–99)
Glucose-Capillary: 164 mg/dL — ABNORMAL HIGH (ref 70–99)

## 2022-11-03 LAB — AEROBIC/ANAEROBIC CULTURE W GRAM STAIN (SURGICAL/DEEP WOUND)

## 2022-11-03 SURGERY — AMPUTATION DIGIT
Anesthesia: General | Site: Toe | Laterality: Left

## 2022-11-03 MED ORDER — LIDOCAINE HCL (PF) 1 % IJ SOLN
INTRAMUSCULAR | Status: DC | PRN
  Administered 2022-11-03: 30 mL

## 2022-11-03 MED ORDER — FENTANYL CITRATE (PF) 100 MCG/2ML IJ SOLN
INTRAMUSCULAR | Status: AC
  Filled 2022-11-03: qty 2

## 2022-11-03 MED ORDER — CHLORHEXIDINE GLUCONATE 0.12 % MT SOLN
15.0000 mL | Freq: Once | OROMUCOSAL | Status: AC
  Administered 2022-11-03: 15 mL via OROMUCOSAL
  Filled 2022-11-03: qty 15

## 2022-11-03 MED ORDER — PROPOFOL 10 MG/ML IV BOLUS
INTRAVENOUS | Status: AC
  Filled 2022-11-03: qty 20

## 2022-11-03 MED ORDER — PROMETHAZINE HCL 25 MG/ML IJ SOLN: 6.2500 mg | INTRAMUSCULAR | Status: DC | PRN

## 2022-11-03 MED ORDER — ORAL CARE MOUTH RINSE: 15.0000 mL | Freq: Once | OROMUCOSAL | Status: AC

## 2022-11-03 MED ORDER — 0.9 % SODIUM CHLORIDE (POUR BTL) OPTIME
TOPICAL | Status: DC | PRN
  Administered 2022-11-03: 1000 mL

## 2022-11-03 MED ORDER — MIDAZOLAM HCL 5 MG/5ML IJ SOLN
INTRAMUSCULAR | Status: DC | PRN
  Administered 2022-11-03: 2 mg via INTRAVENOUS

## 2022-11-03 MED ORDER — DEXMEDETOMIDINE HCL IN NACL 80 MCG/20ML IV SOLN
INTRAVENOUS | Status: AC
  Filled 2022-11-03: qty 20

## 2022-11-03 MED ORDER — OXYCODONE HCL 5 MG/5ML PO SOLN: 5.0000 mg | Freq: Once | ORAL | Status: DC | PRN

## 2022-11-03 MED ORDER — AMISULPRIDE (ANTIEMETIC) 5 MG/2ML IV SOLN: 10.0000 mg | Freq: Once | INTRAVENOUS | Status: DC | PRN

## 2022-11-03 MED ORDER — CEFAZOLIN SODIUM-DEXTROSE 2-4 GM/100ML-% IV SOLN
2.0000 g | INTRAVENOUS | Status: AC
  Administered 2022-11-03: 2 g via INTRAVENOUS
  Filled 2022-11-03: qty 100

## 2022-11-03 MED ORDER — HYDROCODONE-ACETAMINOPHEN 5-325 MG PO TABS: 1.0000 | ORAL_TABLET | ORAL | 0 refills | Status: AC | PRN

## 2022-11-03 MED ORDER — HYDROMORPHONE HCL 1 MG/ML IJ SOLN: 0.2500 mg | INTRAMUSCULAR | Status: DC | PRN

## 2022-11-03 MED ORDER — MIDAZOLAM HCL 2 MG/2ML IJ SOLN
INTRAMUSCULAR | Status: AC
  Filled 2022-11-03: qty 2

## 2022-11-03 MED ORDER — PROPOFOL 10 MG/ML IV BOLUS
INTRAVENOUS | Status: DC | PRN
  Administered 2022-11-03: 20 mg via INTRAVENOUS
  Administered 2022-11-03: 10 mg via INTRAVENOUS
  Administered 2022-11-03 (×9): 20 mg via INTRAVENOUS
  Administered 2022-11-03: 10 mg via INTRAVENOUS
  Administered 2022-11-03 (×4): 20 mg via INTRAVENOUS
  Administered 2022-11-03: 10 mg via INTRAVENOUS
  Administered 2022-11-03: 20 mg via INTRAVENOUS

## 2022-11-03 MED ORDER — LIDOCAINE 2% (20 MG/ML) 5 ML SYRINGE
INTRAMUSCULAR | Status: AC
  Filled 2022-11-03: qty 5

## 2022-11-03 MED ORDER — INSULIN ASPART 100 UNIT/ML IJ SOLN
0.0000 [IU] | INTRAMUSCULAR | Status: DC | PRN
  Administered 2022-11-03: 3 [IU] via SUBCUTANEOUS
  Filled 2022-11-03: qty 1

## 2022-11-03 MED ORDER — LACTATED RINGERS IV SOLN: INTRAVENOUS | Status: DC

## 2022-11-03 MED ORDER — KETOROLAC TROMETHAMINE 30 MG/ML IJ SOLN
INTRAMUSCULAR | Status: DC | PRN
  Administered 2022-11-03: 30 mg via INTRAVENOUS

## 2022-11-03 MED ORDER — ONDANSETRON HCL 4 MG/2ML IJ SOLN
INTRAMUSCULAR | Status: DC | PRN
  Administered 2022-11-03: 4 mg via INTRAVENOUS

## 2022-11-03 MED ORDER — OXYCODONE HCL 5 MG PO TABS: 5.0000 mg | ORAL_TABLET | Freq: Once | ORAL | Status: DC | PRN

## 2022-11-03 MED ORDER — DEXMEDETOMIDINE HCL IN NACL 80 MCG/20ML IV SOLN
INTRAVENOUS | Status: DC | PRN
  Administered 2022-11-03 (×3): 4 ug via INTRAVENOUS
  Administered 2022-11-03: 8 ug via INTRAVENOUS

## 2022-11-03 MED ORDER — FENTANYL CITRATE (PF) 100 MCG/2ML IJ SOLN
INTRAMUSCULAR | Status: DC | PRN
  Administered 2022-11-03 (×2): 50 ug via INTRAVENOUS

## 2022-11-03 MED ORDER — LIDOCAINE 2% (20 MG/ML) 5 ML SYRINGE
INTRAMUSCULAR | Status: AC
  Filled 2022-11-03: qty 15

## 2022-11-03 MED ORDER — KETOROLAC TROMETHAMINE 30 MG/ML IJ SOLN
INTRAMUSCULAR | Status: AC
  Filled 2022-11-03: qty 1

## 2022-11-03 MED ORDER — LIDOCAINE HCL (PF) 1 % IJ SOLN
INTRAMUSCULAR | Status: AC
  Filled 2022-11-03: qty 30

## 2022-11-03 MED ORDER — ONDANSETRON HCL 4 MG/2ML IJ SOLN
INTRAMUSCULAR | Status: AC
  Filled 2022-11-03: qty 4

## 2022-11-03 SURGICAL SUPPLY — 45 items
BAG COUNTER SPONGE SURGICOUNT (BAG) ×2 IMPLANT
BAG SPNG CNTER NS LX DISP (BAG) ×2
BLADE LONG MED 31X9 (MISCELLANEOUS) IMPLANT
BLADE SURG 21 STRL SS (BLADE) ×2 IMPLANT
BNDG CMPR 5X6 CHSV STRCH STRL (GAUZE/BANDAGES/DRESSINGS)
BNDG CMPR 9X4 STRL LF SNTH (GAUZE/BANDAGES/DRESSINGS)
BNDG COHESIVE 1X5 TAN STRL LF (GAUZE/BANDAGES/DRESSINGS) IMPLANT
BNDG COHESIVE 4X5 TAN STRL (GAUZE/BANDAGES/DRESSINGS) ×2 IMPLANT
BNDG COHESIVE 6X5 TAN ST LF (GAUZE/BANDAGES/DRESSINGS) ×2 IMPLANT
BNDG ESMARK 4X9 LF (GAUZE/BANDAGES/DRESSINGS) IMPLANT
BNDG GAUZE DERMACEA FLUFF 4 (GAUZE/BANDAGES/DRESSINGS) ×2 IMPLANT
BNDG GZE DERMACEA 4 6PLY (GAUZE/BANDAGES/DRESSINGS)
COVER MAYO STAND STRL (DRAPES) ×2 IMPLANT
COVER SURGICAL LIGHT HANDLE (MISCELLANEOUS) ×4 IMPLANT
DRAPE DERMATAC (DRAPES) IMPLANT
DRAPE U-SHAPE 47X51 STRL (DRAPES) ×2 IMPLANT
DRESSING PEEL AND PLC PRVNA 13 (GAUZE/BANDAGES/DRESSINGS) IMPLANT
DRSG ADAPTIC 3X8 NADH LF (GAUZE/BANDAGES/DRESSINGS) IMPLANT
DRSG EMULSION OIL 3X3 NADH (GAUZE/BANDAGES/DRESSINGS) ×2 IMPLANT
DRSG PEEL AND PLACE PREVENA 13 (GAUZE/BANDAGES/DRESSINGS) ×2
DURAPREP 26ML APPLICATOR (WOUND CARE) ×2 IMPLANT
ELECT REM PT RETURN 9FT ADLT (ELECTROSURGICAL) ×2
ELECTRODE REM PT RTRN 9FT ADLT (ELECTROSURGICAL) ×2 IMPLANT
GAUZE PAD ABD 8X10 STRL (GAUZE/BANDAGES/DRESSINGS) ×2 IMPLANT
GAUZE SPONGE 4X4 12PLY STRL (GAUZE/BANDAGES/DRESSINGS) ×2 IMPLANT
GLOVE BIOGEL PI IND STRL 9 (GLOVE) ×2 IMPLANT
GLOVE SURG ORTHO 9.0 STRL STRW (GLOVE) ×2 IMPLANT
GOWN STRL REUS W/ TWL XL LVL3 (GOWN DISPOSABLE) ×4 IMPLANT
GOWN STRL REUS W/TWL XL LVL3 (GOWN DISPOSABLE) ×2
GRAFT SKIN WND MICRO 38 (Tissue) IMPLANT
KIT BASIN OR (CUSTOM PROCEDURE TRAY) ×2 IMPLANT
KIT TURNOVER KIT B (KITS) ×2 IMPLANT
MANIFOLD NEPTUNE II (INSTRUMENTS) ×2 IMPLANT
NDL 22X1.5 STRL (OR ONLY) (MISCELLANEOUS) IMPLANT
NEEDLE 22X1.5 STRL (OR ONLY) (MISCELLANEOUS) ×2 IMPLANT
NS IRRIG 1000ML POUR BTL (IV SOLUTION) ×2 IMPLANT
PACK ORTHO EXTREMITY (CUSTOM PROCEDURE TRAY) ×2 IMPLANT
PAD ARMBOARD 7.5X6 YLW CONV (MISCELLANEOUS) ×4 IMPLANT
PADDING CAST ABS COTTON 4X4 ST (CAST SUPPLIES) ×2 IMPLANT
SPONGE T-LAP 18X18 ~~LOC~~+RFID (SPONGE) ×2 IMPLANT
SUT ETHILON 2 0 PSLX (SUTURE) ×2 IMPLANT
SYR CONTROL 10ML LL (SYRINGE) IMPLANT
TOWEL GREEN STERILE (TOWEL DISPOSABLE) ×2 IMPLANT
UNDERPAD 30X36 HEAVY ABSORB (UNDERPADS AND DIAPERS) ×2 IMPLANT
WATER STERILE IRR 1000ML POUR (IV SOLUTION) ×2 IMPLANT

## 2022-11-03 NOTE — Anesthesia Postprocedure Evaluation (Signed)
Anesthesia Post Note  Patient: Kevin Monroe  Procedure(s) Performed: AMPUTATION LEFT GREAT TOE (Left: Toe) LEFT GASTROCNEMIUS RECESSION (Left: Ankle)     Patient location during evaluation: PACU Anesthesia Type: MAC Level of consciousness: awake and alert Pain management: pain level controlled Vital Signs Assessment: post-procedure vital signs reviewed and stable Respiratory status: spontaneous breathing, nonlabored ventilation and respiratory function stable Cardiovascular status: blood pressure returned to baseline and stable Postop Assessment: no apparent nausea or vomiting Anesthetic complications: no   No notable events documented.  Last Vitals:  Vitals:   11/03/22 1345 11/03/22 1400  BP: 107/66 106/65  Pulse: 70 69  Resp: 16 17  Temp:  36.7 C  SpO2: 94% 94%    Last Pain:  Vitals:   11/03/22 1400  PainSc: 0-No pain                 Lowella Curb

## 2022-11-03 NOTE — H&P (Signed)
Kevin Monroe is an 57 y.o. male.   Chief Complaint: Pain and swelling left great toe. HPI: Patient is a 57 year old gentleman who is seen in follow-up for ulceration left great toe with underlying osteomyelitis. Patient is on doxycycline he has been using a felt relieving donut. Patient has recently traveled to [place].   Past Medical History:  Diagnosis Date   Depression    Diabetes mellitus without complication    type 1   Dysthymia    Hyperlipemia    Neuropathy     Past Surgical History:  Procedure Laterality Date   APPENDECTOMY     TOE AMPUTATION Left     Family History  Problem Relation Age of Onset   Diabetes Mother    Thyroid disease Mother    Obesity Mother    Diabetes Father    Depression Father    Suicidality Father    Alcohol abuse Father    Depression Sister    Diabetes Sister    Depression Maternal Grandfather    Suicidality Maternal Grandfather    Suicidality Maternal Aunt    Social History:  reports that he has never smoked. He has never used smokeless tobacco. He reports current alcohol use of about 2.0 standard drinks of alcohol per week. He reports that he does not use drugs.  Allergies: No Known Allergies  No medications prior to admission.    No results found for this or any previous visit (from the past 48 hour(s)). No results found.  Review of Systems  All other systems reviewed and are negative.   Height  (1.88 m), weight 101.6 kg. Physical Exam  Patient is alert, oriented, no adenopathy, well-dressed, normal affect, normal respiratory effort. Examination patient has a strong dorsalis pedis pulse there is increased swelling and increased size of the ulcer left great toe.  With his knee extended patient lacks dorsiflexion to neutral with a contracture of about 20 degrees. Assessment/Plan 1. Achilles tendon contracture, left   2. Acute osteomyelitis of toe, left       Plan: Discussed with patient with the increased swelling  persistent ulceration and failure of antibiotics recommended proceeding with a left great toe amputation through the MTP joint and a gastrocnemius recession  Nadara Mustard, MD 11/03/2022, 7:05 AM

## 2022-11-03 NOTE — Anesthesia Preprocedure Evaluation (Signed)
Anesthesia Evaluation  Patient identified by MRN, date of birth, ID band Patient awake    Reviewed: Allergy & Precautions, H&P , NPO status , Patient's Chart, lab work & pertinent test results  Airway Mallampati: II  TM Distance: >3 FB Neck ROM: Full    Dental no notable dental hx.    Pulmonary neg pulmonary ROS   Pulmonary exam normal breath sounds clear to auscultation       Cardiovascular negative cardio ROS Normal cardiovascular exam Rhythm:Regular Rate:Normal     Neuro/Psych    Depression    negative neurological ROS  negative psych ROS   GI/Hepatic negative GI ROS, Neg liver ROS,,,  Endo/Other  negative endocrine ROSdiabetes    Renal/GU negative Renal ROS  negative genitourinary   Musculoskeletal negative musculoskeletal ROS (+)    Abdominal   Peds negative pediatric ROS (+)  Hematology negative hematology ROS (+)   Anesthesia Other Findings   Reproductive/Obstetrics negative OB ROS                             Anesthesia Physical Anesthesia Plan  ASA: 3  Anesthesia Plan: General   Post-op Pain Management: Regional block*   Induction: Intravenous  PONV Risk Score and Plan: 2 and Ondansetron, Midazolam and Treatment may vary due to age or medical condition  Airway Management Planned: LMA and Oral ETT  Additional Equipment:   Intra-op Plan:   Post-operative Plan: Extubation in OR  Informed Consent: I have reviewed the patients History and Physical, chart, labs and discussed the procedure including the risks, benefits and alternatives for the proposed anesthesia with the patient or authorized representative who has indicated his/her understanding and acceptance.     Dental advisory given  Plan Discussed with: CRNA  Anesthesia Plan Comments:        Anesthesia Quick Evaluation

## 2022-11-03 NOTE — Op Note (Signed)
11/03/2022  1:31 PM  PATIENT:  Kevin Monroe    PRE-OPERATIVE DIAGNOSIS:  Osteomyelitis Left Great Toe and Achilles Contracture  POST-OPERATIVE DIAGNOSIS:  Same  PROCEDURE:  AMPUTATION LEFT FIRST RAY GREAT TOE,  LEFT GASTROCNEMIUS RECESSION Local tissue rearrangement for wound closure 10 x 4 cm. Application of Kerecis micro graft 38 cm.  SURGEON:  Nadara Mustard, MD  PHYSICIAN ASSISTANT:None ANESTHESIA:   General  PREOPERATIVE INDICATIONS:  LIONAL ICENOGLE is a  57 y.o. male with a diagnosis of Osteomyelitis Left Great Toe and Achilles Contracture who failed conservative measures and elected for surgical management.    The risks benefits and alternatives were discussed with the patient preoperatively including but not limited to the risks of infection, bleeding, nerve injury, cardiopulmonary complications, the need for revision surgery, among others, and the patient was willing to proceed.  OPERATIVE IMPLANTS:   Implant Name Type Inv. Item Serial No. Manufacturer Lot No. LRB No. Used Action  [employer] Graft Skin     [id number] Left 1 Implanted    @  OPERATIVE FINDINGS: Tissue margins were resected back to a ray amputation to have clear margins.  The great toe was sent for cultures.  Tissue margins were clear.  There was good petechial bleeding.  OPERATIVE PROCEDURE: Patient was brought the operating room after undergoing a MAC anesthetic the left lower extremity was prepped using DuraPrep draped into a sterile field a timeout was called.  A total of 30 cc of 1% lidocaine plain was used to perform a digital block for the great toe and perform a local block for the gastrocnemius recession.  Attention was first focused on the gastrocnemius recession.  A longitudinal 3 cm incision was made 15 cm proximal to the medial malleolus.  This was carried bluntly down to the fascia.  Under direct visualization a 15 blade knife was used to release the gastrocnemius fascia.   This took his dorsiflexion from about 10 degrees short of neutral to 30 degrees past neutral.  The wound was irrigated normal saline electrocautery was used hemostasis and the incision closed using 2-0 nylon.  A racquet incision was made around the great toe MTP joint.  The tissue margins did not appear clear and this resection margin was carried proximally necessitating a ray amputation.  A oscillating saw was used to perform a ray amputation beveled plantarly.  Electrocardio was used hemostasis the tissue was resected as 1 block of tissue the tissue was sent for cultures.  The wound was irrigated with normal saline.  Tissue margins were clear.  The wound was filled with 38 cm of Kerecis micro graft.  Local tissue was undermined to allow for local tissue rearrangement with wound closure 10 x 4 cm.  This was closed with 2-0 nylon.  A Prevena wound VAC was applied to the great toe this had a good suction fit dry dressing applied to the gastrocnemius recession.  Patient was taken the PACU in stable condition.   DISCHARGE PLANNING:  Antibiotic duration: Preoperative antibiotics continue doxycycline  Weightbearing: Nonweightbearing on the left  Pain medication: Prescription for Vicodin  Dressing care/ Wound VAC: Wound VAC  Ambulatory devices: Crutches  Discharge to: Home.  Follow-up: In the office 1 week post operative.

## 2022-11-03 NOTE — Transfer of Care (Signed)
Immediate Anesthesia Transfer of Care Note  Patient: Kevin Monroe  Procedure(s) Performed: AMPUTATION LEFT GREAT TOE (Left: Toe) LEFT GASTROCNEMIUS RECESSION (Left: Ankle)  Patient Location: PACU  Anesthesia Type:MAC  Level of Consciousness: awake, alert , and oriented  Airway & Oxygen Therapy: Patient Spontanous Breathing  Post-op Assessment: Report given to RN and Post -op Vital signs reviewed and stable  Post vital signs: Reviewed and stable  Last Vitals:  Vitals Value Taken Time  BP    Temp    Pulse    Resp    SpO2      Last Pain:  Vitals:   11/03/22 0941  PainSc: 4          Complications: No notable events documented.

## 2022-11-03 NOTE — Interval H&P Note (Signed)
History and Physical Interval Note:  11/03/2022 11:22 AM  Kevin Monroe  has presented today for surgery, with the diagnosis of Osteomyelitis Left Great Toe and Achilles Contracture.  The various methods of treatment have been discussed with the patient and family. After consideration of risks, benefits and other options for treatment, the patient has consented to  Procedure(s): AMPUTATION LEFT GREAT TOE (Left) LEFT GASTROCNEMIUS RECESSION (Left) as a surgical intervention.  The patient's history has been reviewed, patient examined, no change in status, stable for surgery.  I have reviewed the patient's chart and labs.  Questions were answered to the patient's satisfaction.     Nadara Mustard

## 2022-11-04 ENCOUNTER — Encounter (HOSPITAL_COMMUNITY): Payer: Self-pay | Admitting: Orthopedic Surgery

## 2022-11-04 ENCOUNTER — Telehealth: Payer: Self-pay | Admitting: Orthopedic Surgery

## 2022-11-04 NOTE — Telephone Encounter (Signed)
Patient need work not with restrictions. Please advise.Marland Kitchen

## 2022-11-04 NOTE — Telephone Encounter (Signed)
Needs work note with restrictions? Did he say what restrictions?

## 2022-11-04 NOTE — Telephone Encounter (Signed)
He just said that Dr. Lajoyce Corners told him he could go back to work Monday with restrictions. He didn't say exactly what restrictions.

## 2022-11-05 ENCOUNTER — Telehealth: Payer: Self-pay | Admitting: Orthopedic Surgery

## 2022-11-05 NOTE — Telephone Encounter (Signed)
Pt called stating he heed a return to work for Monday 11/08/22. Pt states his company is asking for this right away. Note need to specify no weight on knee (light duty). Pleas send to pt mychart. Pt phone number is [phone].

## 2022-11-05 NOTE — Telephone Encounter (Signed)
Letter is in his mychart 

## 2022-11-05 NOTE — Telephone Encounter (Signed)
Pt informed

## 2022-11-06 LAB — AEROBIC/ANAEROBIC CULTURE W GRAM STAIN (SURGICAL/DEEP WOUND): Gram Stain: NONE SEEN

## 2022-11-07 LAB — AEROBIC/ANAEROBIC CULTURE W GRAM STAIN (SURGICAL/DEEP WOUND)

## 2022-11-09 ENCOUNTER — Other Ambulatory Visit: Payer: Self-pay | Admitting: Family

## 2022-11-10 ENCOUNTER — Encounter: Payer: Self-pay | Admitting: Family

## 2022-11-10 ENCOUNTER — Ambulatory Visit (INDEPENDENT_AMBULATORY_CARE_PROVIDER_SITE_OTHER): Payer: 59 | Admitting: Family

## 2022-11-10 VITALS — Ht 74.0 in | Wt 225.0 lb

## 2022-11-10 DIAGNOSIS — M869 Osteomyelitis, unspecified: Secondary | ICD-10-CM

## 2022-11-10 DIAGNOSIS — M6702 Short Achilles tendon (acquired), left ankle: Secondary | ICD-10-CM

## 2022-11-10 NOTE — Progress Notes (Signed)
   Post-Op Visit Note   Patient: Kevin Monroe           Date of Birth: [birth year]           MRN: [id number] Visit Date: 11/10/2022 PCP: Georgann Housekeeper, MD  Chief Complaint:  Chief Complaint  Patient presents with   Left Foot - Routine Post Op    11/03/2022 Left Great Toe Amputation    HPI:  HPI The patient is a 57 year old gentleman seen status post left great toe amputation and gastroc recession on the left.  Has been non weightbearing in his cam walker with a kneeling scooter Ortho Exam On examination of the left lower extremity he does have edema up to the tibial tubercle.  There is no erythema.  The Incision is well-approximated sutures and clean and dry and intact.  The great toe amputation is approximated sutures there is no gaping there is some surrounding maceration and scant serosanguineous drainage.  No erythema no sign of infection  Visit Diagnoses: No diagnosis found.  Plan: Begin daily Dial soap cleansing.  Dry dressings.  Nonweightbearing.  Follow-up in the office in 2 weeks for suture removal.  Anticipate advancing weightbearing at that time.  Follow-Up Instructions: No follow-ups on file.   Imaging: No results found.  Orders:  No orders of the defined types were placed in this encounter.  No orders of the defined types were placed in this encounter.    PMFS History: Patient Active Problem List   Diagnosis Date Noted   Osteomyelitis of great toe of left foot (HCC) 11/03/2022   Contracture of left Achilles tendon 11/03/2022   Tinea pedis of right foot 12/21/2016   Past Medical History:  Diagnosis Date   Depression    Diabetes mellitus without complication (HCC)    type 1   Dysthymia    Hyperlipemia    Neuropathy     Family History  Problem Relation Age of Onset   Diabetes Mother    Thyroid disease Mother    Obesity Mother    Diabetes Father    Depression Father    Suicidality Father    Alcohol abuse Father    Depression Sister    Diabetes  Sister    Depression Maternal Grandfather    Suicidality Maternal Grandfather    Suicidality Maternal Aunt     Past Surgical History:  Procedure Laterality Date   AMPUTATION Left 11/03/2022   Procedure: AMPUTATION LEFT GREAT TOE;  Surgeon: Nadara Mustard, MD;  Location: [facility] OR;  Service: Orthopedics;  Laterality: Left;   APPENDECTOMY     GASTROCNEMIUS RECESSION Left 11/03/2022   Procedure: LEFT GASTROCNEMIUS RECESSION;  Surgeon: Nadara Mustard, MD;  Location: [facility] OR;  Service: Orthopedics;  Laterality: Left;   TOE AMPUTATION Left    Social History   Occupational History   Not on file  Tobacco Use   Smoking status: Never   Smokeless tobacco: Current    Types: Chew, Snuff  Vaping Use   Vaping Use: Never used  Substance and Sexual Activity   Alcohol use: Yes    Alcohol/week: 2.0 standard drinks of alcohol    Types: 2 Standard drinks or equivalent per week    Comment: occasionally   Drug use: Never   Sexual activity: Not on file

## 2022-11-16 ENCOUNTER — Other Ambulatory Visit: Payer: Self-pay | Admitting: Orthopedic Surgery

## 2022-11-16 MED ORDER — DOXYCYCLINE HYCLATE 100 MG PO TABS: 100.0000 mg | ORAL_TABLET | Freq: Two times a day (BID) | ORAL | 0 refills | Status: AC

## 2022-11-25 ENCOUNTER — Ambulatory Visit (INDEPENDENT_AMBULATORY_CARE_PROVIDER_SITE_OTHER): Payer: 59 | Admitting: Orthopedic Surgery

## 2022-11-25 DIAGNOSIS — M869 Osteomyelitis, unspecified: Secondary | ICD-10-CM

## 2022-11-25 DIAGNOSIS — M6702 Short Achilles tendon (acquired), left ankle: Secondary | ICD-10-CM

## 2022-12-02 ENCOUNTER — Other Ambulatory Visit: Payer: Self-pay | Admitting: Orthopedic Surgery

## 2022-12-05 ENCOUNTER — Encounter: Payer: Self-pay | Admitting: Orthopedic Surgery

## 2022-12-05 NOTE — Progress Notes (Signed)
Office Visit Note   Patient: Kevin Monroe           Date of Birth: 1966-04-08           MRN: [id number] Visit Date: 11/25/2022              Requested by: Georgann Housekeeper, MD [address] [place],  [place] [address] PCP: Georgann Housekeeper, MD  Chief Complaint  Patient presents with   Left Foot - Routine Post Op    11/03/2022 left great toe amputation       HPI: Patient is a 57 year old gentleman who is 3 weeks status post left great toe amputation and gastrocnemius recession.  He is nonweightbearing in a fracture boot with crutches  Assessment & Plan: Visit Diagnoses:  1. Osteomyelitis of great toe of left foot (HCC)   2. Contracture of left Achilles tendon     Plan: Dial soap cleansing dry dressing change reevaluate in 2 weeks.  Follow-Up Instructions: Return in about 2 weeks (around 12/09/2022).   Ortho Exam  Patient is alert, oriented, no adenopathy, well-dressed, normal affect, normal respiratory effort. Examination the incision is well-approximated there is swelling small amount of drainage no cellulitis  Imaging: No results found. No images are attached to the encounter.  Labs: Lab Results  Component Value Date   REPTSTATUS 11/07/2022 FINAL 11/03/2022   GRAMSTAIN NO WBC SEEN NO ORGANISMS SEEN  11/03/2022   CULT (A) 11/03/2022    MULTIPLE ORGANISMS PRESENT, NONE PREDOMINANT NO ANAEROBES ISOLATED Performed at [facility], [address] [place], [place] [address]      Lab Results  Component Value Date   ALBUMIN 4.8 08/27/2015    No results found for: "MG" No results found for: "VD25OH"  No results found for: "PREALBUMIN"    Latest Ref Rng & Units 11/03/2022   10:15 AM 08/27/2015   10:20 PM 10/14/2014    5:09 PM  CBC EXTENDED  WBC 4.0 - 10.5 K/uL 12.0  14.8    RBC 4.22 - 5.81 MIL/uL 4.00  5.04    Hemoglobin 13.0 - 17.0 g/dL 40.1  02.7  25.3   HCT 39.0 - 52.0 % 37.7  46.2  45.0   Platelets 150 - 400 K/uL 215  189        There is no height or weight on file to calculate BMI.  Orders:  No orders of the defined types were placed in this encounter.  No orders of the defined types were placed in this encounter.    Procedures: No procedures performed  Clinical Data: No additional findings.  ROS:  All other systems negative, except as noted in the HPI. Review of Systems  Objective: Vital Signs: There were no vitals taken for this visit.  Specialty Comments:  No specialty comments available.  PMFS History: Patient Active Problem List   Diagnosis Date Noted   Osteomyelitis of great toe of left foot (HCC) 11/03/2022   Contracture of left Achilles tendon 11/03/2022   Tinea pedis of right foot 12/21/2016   Past Medical History:  Diagnosis Date   Depression    Diabetes mellitus without complication (HCC)    type 1   Dysthymia    Hyperlipemia    Neuropathy     Family History  Problem Relation Age of Onset   Diabetes Mother    Thyroid disease Mother    Obesity Mother    Diabetes Father    Depression Father    Suicidality  Father    Alcohol abuse Father    Depression Sister    Diabetes Sister    Depression Maternal Grandfather    Suicidality Maternal Grandfather    Suicidality Maternal Aunt     Past Surgical History:  Procedure Laterality Date   AMPUTATION Left 11/03/2022   Procedure: AMPUTATION LEFT GREAT TOE;  Surgeon: Nadara Mustard, MD;  Location: [facility] OR;  Service: Orthopedics;  Laterality: Left;   APPENDECTOMY     GASTROCNEMIUS RECESSION Left 11/03/2022   Procedure: LEFT GASTROCNEMIUS RECESSION;  Surgeon: Nadara Mustard, MD;  Location: [facility] OR;  Service: Orthopedics;  Laterality: Left;   TOE AMPUTATION Left    Social History   Occupational History   Not on file  Tobacco Use   Smoking status: Never   Smokeless tobacco: Current    Types: Chew, Snuff  Vaping Use   Vaping Use: Never used  Substance and Sexual Activity   Alcohol use: Yes    Alcohol/week: 2.0 standard  drinks of alcohol    Types: 2 Standard drinks or equivalent per week    Comment: occasionally   Drug use: Never   Sexual activity: Not on file

## 2022-12-14 ENCOUNTER — Ambulatory Visit (INDEPENDENT_AMBULATORY_CARE_PROVIDER_SITE_OTHER): Payer: 59 | Admitting: Orthopedic Surgery

## 2022-12-14 DIAGNOSIS — Z89412 Acquired absence of left great toe: Secondary | ICD-10-CM

## 2022-12-24 ENCOUNTER — Encounter: Payer: Self-pay | Admitting: Orthopedic Surgery

## 2022-12-24 NOTE — Progress Notes (Signed)
Office Visit Note   Patient: Kevin Monroe           Date of Birth: [birth year]           MRN: [id number] Visit Date: 12/14/2022              Requested by: Nadara Mustard, MD [address] [place],  [place] [address] PCP: Georgann Housekeeper, MD  Chief Complaint  Patient presents with   Left Foot - Routine Post Op    11/03/2022 left great toe amputation       HPI: Patient is a 57 year old gentleman who is status post left great toe amputation.  He is about 6 weeks out from surgery.  Assessment & Plan: Visit Diagnoses:  1. Left great toe amputee (HCC)     Plan: Sutures are harvested the incision is well-healed continue protected weightbearing.  Follow-Up Instructions: Return in about 2 weeks (around 12/28/2022).   Ortho Exam  Patient is alert, oriented, no adenopathy, well-dressed, normal affect, normal respiratory effort. Examination the amputation incision is well-healed there is no cellulitis no dehiscence.  Imaging: No results found. No images are attached to the encounter.  Labs: Lab Results  Component Value Date   REPTSTATUS 11/07/2022 FINAL 11/03/2022   GRAMSTAIN NO WBC SEEN NO ORGANISMS SEEN  11/03/2022   CULT (A) 11/03/2022    MULTIPLE ORGANISMS PRESENT, NONE PREDOMINANT NO ANAEROBES ISOLATED Performed at [facility], [address] [place], [place] [address]      Lab Results  Component Value Date   ALBUMIN 4.8 08/27/2015    No results found for: "MG" No results found for: "VD25OH"  No results found for: "PREALBUMIN"    Latest Ref Rng & Units 11/03/2022   10:15 AM 08/27/2015   10:20 PM 10/14/2014    5:09 PM  CBC EXTENDED  WBC 4.0 - 10.5 K/uL 12.0  14.8    RBC 4.22 - 5.81 MIL/uL 4.00  5.04    Hemoglobin 13.0 - 17.0 g/dL 82.9  56.2  13.0   HCT 39.0 - 52.0 % 37.7  46.2  45.0   Platelets 150 - 400 K/uL 215  189       There is no height or weight on file to calculate BMI.  Orders:  No orders of the defined types were placed in this  encounter.  No orders of the defined types were placed in this encounter.    Procedures: No procedures performed  Clinical Data: No additional findings.  ROS:  All other systems negative, except as noted in the HPI. Review of Systems  Objective: Vital Signs: There were no vitals taken for this visit.  Specialty Comments:  No specialty comments available.  PMFS History: Patient Active Problem List   Diagnosis Date Noted   Osteomyelitis of great toe of left foot (HCC) 11/03/2022   Contracture of left Achilles tendon 11/03/2022   Tinea pedis of right foot 12/21/2016   Past Medical History:  Diagnosis Date   Depression    Diabetes mellitus without complication (HCC)    type 1   Dysthymia    Hyperlipemia    Neuropathy     Family History  Problem Relation Age of Onset   Diabetes Mother    Thyroid disease Mother    Obesity Mother    Diabetes Father    Depression Father    Suicidality Father    Alcohol abuse Father    Depression Sister    Diabetes Sister    Depression  Maternal Grandfather    Suicidality Maternal Grandfather    Suicidality Maternal Aunt     Past Surgical History:  Procedure Laterality Date   AMPUTATION Left 11/03/2022   Procedure: AMPUTATION LEFT GREAT TOE;  Surgeon: Nadara Mustard, MD;  Location: [facility] OR;  Service: Orthopedics;  Laterality: Left;   APPENDECTOMY     GASTROCNEMIUS RECESSION Left 11/03/2022   Procedure: LEFT GASTROCNEMIUS RECESSION;  Surgeon: Nadara Mustard, MD;  Location: [facility] OR;  Service: Orthopedics;  Laterality: Left;   TOE AMPUTATION Left    Social History   Occupational History   Not on file  Tobacco Use   Smoking status: Never   Smokeless tobacco: Current    Types: Chew, Snuff  Vaping Use   Vaping Use: Never used  Substance and Sexual Activity   Alcohol use: Yes    Alcohol/week: 2.0 standard drinks of alcohol    Types: 2 Standard drinks or equivalent per week    Comment: occasionally   Drug use: Never   Sexual  activity: Not on file

## 2022-12-27 ENCOUNTER — Ambulatory Visit (INDEPENDENT_AMBULATORY_CARE_PROVIDER_SITE_OTHER): Payer: 59 | Admitting: Orthopedic Surgery

## 2022-12-27 DIAGNOSIS — M6702 Short Achilles tendon (acquired), left ankle: Secondary | ICD-10-CM

## 2022-12-27 DIAGNOSIS — Z89412 Acquired absence of left great toe: Secondary | ICD-10-CM

## 2022-12-28 ENCOUNTER — Encounter: Payer: Self-pay | Admitting: Orthopedic Surgery

## 2022-12-28 NOTE — Progress Notes (Signed)
Office Visit Note   Patient: Kevin Monroe           Date of Birth: Jun 21, 1966           MRN: [id number] Visit Date: 12/27/2022              Requested by: Georgann Housekeeper, MD [address] [place],  [place] [address] PCP: Georgann Housekeeper, MD  Chief Complaint  Patient presents with   Left Foot - Routine Post Op    11/03/2022 left great toe amputation      HPI: Patient is a 57 year old gentleman who is status post left great toe amputation and gastrocnemius recession.  He is protected weightbearing.  Assessment & Plan: Visit Diagnoses:  1. Left great toe amputee (HCC)   2. Contracture of left Achilles tendon     Plan: Recommended a extra-large compression sock.  Continue with protected weightbearing continue with routine wound care.  Follow-Up Instructions: Return in about 1 week (around 01/03/2023).   Ortho Exam  Patient is alert, oriented, no adenopathy, well-dressed, normal affect, normal respiratory effort. Examination patient has increased venous swelling in the left leg there is a new blister over the gastrocnemius recession.  He has a strong dorsalis pedis pulse.  There is ischemic ulcer over the great toe amputation.  This is 1 cm in diameter this was debrided back to bleeding viable healthy granulation tissue with a 10 blade knife.  Touched with silver nitrate.  Imaging: No results found. No images are attached to the encounter.  Labs: Lab Results  Component Value Date   REPTSTATUS 11/07/2022 FINAL 11/03/2022   GRAMSTAIN NO WBC SEEN NO ORGANISMS SEEN  11/03/2022   CULT (A) 11/03/2022    MULTIPLE ORGANISMS PRESENT, NONE PREDOMINANT NO ANAEROBES ISOLATED Performed at [facility], [address], [place], [place] [address]      Lab Results  Component Value Date   ALBUMIN 4.8 08/27/2015    No results found for: "MG" No results found for: "VD25OH"  No results found for: "PREALBUMIN"    Latest Ref Rng & Units 11/03/2022   10:15 AM  08/27/2015   10:20 PM 10/14/2014    5:09 PM  CBC EXTENDED  WBC 4.0 - 10.5 K/uL 12.0  14.8    RBC 4.22 - 5.81 MIL/uL 4.00  5.04    Hemoglobin 13.0 - 17.0 g/dL 82.9  56.2  13.0   HCT 39.0 - 52.0 % 37.7  46.2  45.0   Platelets 150 - 400 K/uL 215  189       There is no height or weight on file to calculate BMI.  Orders:  No orders of the defined types were placed in this encounter.  No orders of the defined types were placed in this encounter.    Procedures: No procedures performed  Clinical Data: No additional findings.  ROS:  All other systems negative, except as noted in the HPI. Review of Systems  Objective: Vital Signs: There were no vitals taken for this visit.  Specialty Comments:  No specialty comments available.  PMFS History: Patient Active Problem List   Diagnosis Date Noted   Osteomyelitis of great toe of left foot (HCC) 11/03/2022   Contracture of left Achilles tendon 11/03/2022   Tinea pedis of right foot 12/21/2016   Past Medical History:  Diagnosis Date   Depression    Diabetes mellitus without complication (HCC)    type 1   Dysthymia    Hyperlipemia  Neuropathy     Family History  Problem Relation Age of Onset   Diabetes Mother    Thyroid disease Mother    Obesity Mother    Diabetes Father    Depression Father    Suicidality Father    Alcohol abuse Father    Depression Sister    Diabetes Sister    Depression Maternal Grandfather    Suicidality Maternal Grandfather    Suicidality Maternal Aunt     Past Surgical History:  Procedure Laterality Date   AMPUTATION Left 11/03/2022   Procedure: AMPUTATION LEFT GREAT TOE;  Surgeon: Nadara Mustard, MD;  Location: [facility] OR;  Service: Orthopedics;  Laterality: Left;   APPENDECTOMY     GASTROCNEMIUS RECESSION Left 11/03/2022   Procedure: LEFT GASTROCNEMIUS RECESSION;  Surgeon: Nadara Mustard, MD;  Location: [facility] OR;  Service: Orthopedics;  Laterality: Left;   TOE AMPUTATION Left    Social History    Occupational History   Not on file  Tobacco Use   Smoking status: Never   Smokeless tobacco: Current    Types: Chew, Snuff  Vaping Use   Vaping Use: Never used  Substance and Sexual Activity   Alcohol use: Yes    Alcohol/week: 2.0 standard drinks of alcohol    Types: 2 Standard drinks or equivalent per week    Comment: occasionally   Drug use: Never   Sexual activity: Not on file

## 2023-01-03 ENCOUNTER — Encounter: Payer: Self-pay | Admitting: Orthopedic Surgery

## 2023-01-03 ENCOUNTER — Ambulatory Visit (INDEPENDENT_AMBULATORY_CARE_PROVIDER_SITE_OTHER): Payer: 59 | Admitting: Orthopedic Surgery

## 2023-01-03 DIAGNOSIS — Z89412 Acquired absence of left great toe: Secondary | ICD-10-CM

## 2023-01-03 DIAGNOSIS — M6702 Short Achilles tendon (acquired), left ankle: Secondary | ICD-10-CM

## 2023-01-03 NOTE — Progress Notes (Signed)
Office Visit Note   Patient: Kevin Monroe           Date of Birth: November 06, 1965           MRN: [id number] Visit Date: 01/03/2023              Requested by: Georgann Housekeeper, MD [address] [place],  [place] [address] PCP: Georgann Housekeeper, MD  Chief Complaint  Patient presents with   Left Foot - Routine Post Op    11/03/2022 left GT amputation       HPI: Patient is a 57 year old gentleman status post gastrocnemius recession and great toe amputation on the left.  Patient has had wound breakdowns for both incisions.  He is 2 months out from surgery.  Assessment & Plan: Visit Diagnoses:  1. Left great toe amputee (HCC)   2. Contracture of left Achilles tendon     Plan: Patient will continue with the compression sock.  Work on elevation and range of motion exercises to help improve the venous pump.  Follow-Up Instructions: No follow-ups on file.   Ortho Exam  Patient is alert, oriented, no adenopathy, well-dressed, normal affect, normal respiratory effort. Examination patient has pitting edema with venous insufficiency.  The calf ulcer is flat and is 10 x 25 mm and 0.1 mm deep with healthy flat granulation tissue.  The great toe amputation is 10 x 15 mm and 2 mm deep with healthy granulation tissue.  Patient has a strong palpable dorsalis pedis pulse.  Imaging: No results found. No images are attached to the encounter.  Labs: Lab Results  Component Value Date   REPTSTATUS 11/07/2022 FINAL 11/03/2022   GRAMSTAIN NO WBC SEEN NO ORGANISMS SEEN  11/03/2022   CULT (A) 11/03/2022    MULTIPLE ORGANISMS PRESENT, NONE PREDOMINANT NO ANAEROBES ISOLATED Performed at [facility], [address], [place], [place] [address]      Lab Results  Component Value Date   ALBUMIN 4.8 08/27/2015    No results found for: "MG" No results found for: "VD25OH"  No results found for: "PREALBUMIN"    Latest Ref Rng & Units 11/03/2022   10:15 AM 08/27/2015   10:20  PM 10/14/2014    5:09 PM  CBC EXTENDED  WBC 4.0 - 10.5 K/uL 12.0  14.8    RBC 4.22 - 5.81 MIL/uL 4.00  5.04    Hemoglobin 13.0 - 17.0 g/dL 82.9  56.2  13.0   HCT 39.0 - 52.0 % 37.7  46.2  45.0   Platelets 150 - 400 K/uL 215  189       There is no height or weight on file to calculate BMI.  Orders:  No orders of the defined types were placed in this encounter.  No orders of the defined types were placed in this encounter.    Procedures: No procedures performed  Clinical Data: No additional findings.  ROS:  All other systems negative, except as noted in the HPI. Review of Systems  Objective: Vital Signs: There were no vitals taken for this visit.  Specialty Comments:  No specialty comments available.  PMFS History: Patient Active Problem List   Diagnosis Date Noted   Osteomyelitis of great toe of left foot (HCC) 11/03/2022   Contracture of left Achilles tendon 11/03/2022   Tinea pedis of right foot 12/21/2016   Past Medical History:  Diagnosis Date   Depression    Diabetes mellitus without complication (HCC)    type 1  Dysthymia    Hyperlipemia    Neuropathy     Family History  Problem Relation Age of Onset   Diabetes Mother    Thyroid disease Mother    Obesity Mother    Diabetes Father    Depression Father    Suicidality Father    Alcohol abuse Father    Depression Sister    Diabetes Sister    Depression Maternal Grandfather    Suicidality Maternal Grandfather    Suicidality Maternal Aunt     Past Surgical History:  Procedure Laterality Date   AMPUTATION Left 11/03/2022   Procedure: AMPUTATION LEFT GREAT TOE;  Surgeon: Nadara Mustard, MD;  Location: [facility] OR;  Service: Orthopedics;  Laterality: Left;   APPENDECTOMY     GASTROCNEMIUS RECESSION Left 11/03/2022   Procedure: LEFT GASTROCNEMIUS RECESSION;  Surgeon: Nadara Mustard, MD;  Location: [facility] OR;  Service: Orthopedics;  Laterality: Left;   TOE AMPUTATION Left    Social History   Occupational  History   Not on file  Tobacco Use   Smoking status: Never   Smokeless tobacco: Current    Types: Chew, Snuff  Vaping Use   Vaping Use: Never used  Substance and Sexual Activity   Alcohol use: Yes    Alcohol/week: 2.0 standard drinks of alcohol    Types: 2 Standard drinks or equivalent per week    Comment: occasionally   Drug use: Never   Sexual activity: Not on file

## 2023-01-20 ENCOUNTER — Ambulatory Visit (INDEPENDENT_AMBULATORY_CARE_PROVIDER_SITE_OTHER): Payer: 59 | Admitting: Orthopedic Surgery

## 2023-01-20 DIAGNOSIS — Z89412 Acquired absence of left great toe: Secondary | ICD-10-CM

## 2023-01-28 ENCOUNTER — Encounter: Payer: Self-pay | Admitting: Orthopedic Surgery

## 2023-01-28 NOTE — Progress Notes (Signed)
Office Visit Note   Patient: Kevin Monroe           Date of Birth: [birth year]           MRN: [id number] Visit Date: 01/20/2023              Requested by: Georgann Housekeeper, MD [address] [place],  [place] [address] PCP: Georgann Housekeeper, MD  Chief Complaint  Patient presents with   Left Foot - Routine Post Op    11/03/2022 left GT amputation      HPI: 3 months s/p amputation left great toe  Assessment & Plan: Visit Diagnoses:  1. Left great toe amputee (HCC)     Plan: compression sock was given to patient  Follow-Up Instructions: Return in about 4 weeks (around 02/17/2023).   Ortho Exam  Patient is alert, oriented, no adenopathy, well-dressed, normal affect, normal respiratory effort. Toe ulcer 1 cm in diameter  Imaging: No results found. No images are attached to the encounter.  Labs: Lab Results  Component Value Date   REPTSTATUS 11/07/2022 FINAL 11/03/2022   GRAMSTAIN NO WBC SEEN NO ORGANISMS SEEN  11/03/2022   CULT (A) 11/03/2022    MULTIPLE ORGANISMS PRESENT, NONE PREDOMINANT NO ANAEROBES ISOLATED Performed at [facility], [address], [place], [place] [address]      Lab Results  Component Value Date   ALBUMIN 4.8 08/27/2015    No results found for: "MG" No results found for: "VD25OH"  No results found for: "PREALBUMIN"    Latest Ref Rng & Units 11/03/2022   10:15 AM 08/27/2015   10:20 PM 10/14/2014    5:09 PM  CBC EXTENDED  WBC 4.0 - 10.5 K/uL 12.0  14.8    RBC 4.22 - 5.81 MIL/uL 4.00  5.04    Hemoglobin 13.0 - 17.0 g/dL 82.9  56.2  13.0   HCT 39.0 - 52.0 % 37.7  46.2  45.0   Platelets 150 - 400 K/uL 215  189       There is no height or weight on file to calculate BMI.  Orders:  No orders of the defined types were placed in this encounter.  No orders of the defined types were placed in this encounter.    Procedures: No procedures performed  Clinical Data: No additional findings.  ROS:  All other  systems negative, except as noted in the HPI. Review of Systems  Objective: Vital Signs: There were no vitals taken for this visit.  Specialty Comments:  No specialty comments available.  PMFS History: Patient Active Problem List   Diagnosis Date Noted   Osteomyelitis of great toe of left foot (HCC) 11/03/2022   Contracture of left Achilles tendon 11/03/2022   Tinea pedis of right foot 12/21/2016   Past Medical History:  Diagnosis Date   Depression    Diabetes mellitus without complication (HCC)    type 1   Dysthymia    Hyperlipemia    Neuropathy     Family History  Problem Relation Age of Onset   Diabetes Mother    Thyroid disease Mother    Obesity Mother    Diabetes Father    Depression Father    Suicidality Father    Alcohol abuse Father    Depression Sister    Diabetes Sister    Depression Maternal Grandfather    Suicidality Maternal Grandfather    Suicidality Maternal Aunt     Past Surgical History:  Procedure Laterality Date  AMPUTATION Left 11/03/2022   Procedure: AMPUTATION LEFT GREAT TOE;  Surgeon: Nadara Mustard, MD;  Location: [facility] OR;  Service: Orthopedics;  Laterality: Left;   APPENDECTOMY     GASTROCNEMIUS RECESSION Left 11/03/2022   Procedure: LEFT GASTROCNEMIUS RECESSION;  Surgeon: Nadara Mustard, MD;  Location: [facility] OR;  Service: Orthopedics;  Laterality: Left;   TOE AMPUTATION Left    Social History   Occupational History   Not on file  Tobacco Use   Smoking status: Never   Smokeless tobacco: Current    Types: Chew, Snuff  Vaping Use   Vaping status: Never Used  Substance and Sexual Activity   Alcohol use: Yes    Alcohol/week: 2.0 standard drinks of alcohol    Types: 2 Standard drinks or equivalent per week    Comment: occasionally   Drug use: Never   Sexual activity: Not on file

## 2023-01-31 ENCOUNTER — Encounter: Payer: Self-pay | Admitting: Orthopedic Surgery

## 2023-02-01 ENCOUNTER — Telehealth: Payer: Self-pay | Admitting: Orthopedic Surgery

## 2023-02-01 NOTE — Telephone Encounter (Signed)
Pt sent mychart message requesting work note.

## 2023-02-17 ENCOUNTER — Ambulatory Visit (INDEPENDENT_AMBULATORY_CARE_PROVIDER_SITE_OTHER): Payer: 59 | Admitting: Orthopedic Surgery

## 2023-02-17 DIAGNOSIS — Z89412 Acquired absence of left great toe: Secondary | ICD-10-CM

## 2023-02-21 ENCOUNTER — Encounter: Payer: Self-pay | Admitting: Orthopedic Surgery

## 2023-02-21 NOTE — Progress Notes (Signed)
Office Visit Note   Patient: Kevin Monroe           Date of Birth: 11-12-1965           MRN: [id number] Visit Date: 02/17/2023              Requested by: Georgann Housekeeper, MD [address] [place],  [place] [address] PCP: Georgann Housekeeper, MD  Chief Complaint  Patient presents with   Left Foot - Follow-up    11/03/2022 left GT amputation      HPI: Patient is a 57 year old gentleman is 3 months status post left great toe amputation.  Patient is currently in a compression sock regular shoewear ambulating with 1 crutch.  Assessment & Plan: Visit Diagnoses:  1. Left great toe amputee (HCC)     Plan: Continue compression sock and protective shoe wear.  Follow-Up Instructions: Return in about 4 weeks (around 03/17/2023).   Ortho Exam  Patient is alert, oriented, no adenopathy, well-dressed, normal affect, normal respiratory effort. Examination patient has some abrasions over the dorsum of his foot secondary to sand in his beach shoes rubbing on the dorsum of his foot.  The amputation is well-healed his foot is swollen  Imaging: No results found. No images are attached to the encounter.  Labs: Lab Results  Component Value Date   REPTSTATUS 11/07/2022 FINAL 11/03/2022   GRAMSTAIN NO WBC SEEN NO ORGANISMS SEEN  11/03/2022   CULT (A) 11/03/2022    MULTIPLE ORGANISMS PRESENT, NONE PREDOMINANT NO ANAEROBES ISOLATED Performed at [facility], [address] [place], [place] [address]      Lab Results  Component Value Date   ALBUMIN 4.8 08/27/2015    No results found for: "MG" No results found for: "VD25OH"  No results found for: "PREALBUMIN"    Latest Ref Rng & Units 11/03/2022   10:15 AM 08/27/2015   10:20 PM 10/14/2014    5:09 PM  CBC EXTENDED  WBC 4.0 - 10.5 K/uL 12.0  14.8    RBC 4.22 - 5.81 MIL/uL 4.00  5.04    Hemoglobin 13.0 - 17.0 g/dL 73.2  20.2  54.2   HCT 39.0 - 52.0 % 37.7  46.2  45.0   Platelets 150 - 400 K/uL 215  189        There is no height or weight on file to calculate BMI.  Orders:  No orders of the defined types were placed in this encounter.  No orders of the defined types were placed in this encounter.    Procedures: No procedures performed  Clinical Data: No additional findings.  ROS:  All other systems negative, except as noted in the HPI. Review of Systems  Objective: Vital Signs: There were no vitals taken for this visit.  Specialty Comments:  No specialty comments available.  PMFS History: Patient Active Problem List   Diagnosis Date Noted   Osteomyelitis of great toe of left foot (HCC) 11/03/2022   Contracture of left Achilles tendon 11/03/2022   Tinea pedis of right foot 12/21/2016   Past Medical History:  Diagnosis Date   Depression    Diabetes mellitus without complication (HCC)    type 1   Dysthymia    Hyperlipemia    Neuropathy     Family History  Problem Relation Age of Onset   Diabetes Mother    Thyroid disease Mother    Obesity Mother    Diabetes Father    Depression Father  Suicidality Father    Alcohol abuse Father    Depression Sister    Diabetes Sister    Depression Maternal Grandfather    Suicidality Maternal Grandfather    Suicidality Maternal Aunt     Past Surgical History:  Procedure Laterality Date   AMPUTATION Left 11/03/2022   Procedure: AMPUTATION LEFT GREAT TOE;  Surgeon: Nadara Mustard, MD;  Location: [facility] OR;  Service: Orthopedics;  Laterality: Left;   APPENDECTOMY     GASTROCNEMIUS RECESSION Left 11/03/2022   Procedure: LEFT GASTROCNEMIUS RECESSION;  Surgeon: Nadara Mustard, MD;  Location: [facility] OR;  Service: Orthopedics;  Laterality: Left;   TOE AMPUTATION Left    Social History   Occupational History   Not on file  Tobacco Use   Smoking status: Never   Smokeless tobacco: Current    Types: Chew, Snuff  Vaping Use   Vaping status: Never Used  Substance and Sexual Activity   Alcohol use: Yes    Alcohol/week: 2.0 standard  drinks of alcohol    Types: 2 Standard drinks or equivalent per week    Comment: occasionally   Drug use: Never   Sexual activity: Not on file
